# Patient Record
Sex: Female | Born: 1985 | Race: Black or African American | Hispanic: No | Marital: Single | Smoking: Former smoker
Health system: Southern US, Community
[De-identification: ages and names within clinical notes are randomized; demographics above are authoritative.]

## PROBLEM LIST (undated history)

## (undated) ENCOUNTER — Inpatient Hospital Stay: Payer: Self-pay

## (undated) ENCOUNTER — Emergency Department: Discharge status: Absent without leave | Payer: Medicare Other

## (undated) DIAGNOSIS — F32A Depression, unspecified: Secondary | ICD-10-CM

## (undated) DIAGNOSIS — F259 Schizoaffective disorder, unspecified: Secondary | ICD-10-CM

## (undated) DIAGNOSIS — D352 Benign neoplasm of pituitary gland: Secondary | ICD-10-CM

## (undated) DIAGNOSIS — F329 Major depressive disorder, single episode, unspecified: Secondary | ICD-10-CM

## (undated) HISTORY — DX: Benign neoplasm of pituitary gland: D35.2

## (undated) HISTORY — PX: NO PAST SURGERIES: SHX2092

---

## 2008-09-05 ENCOUNTER — Emergency Department: Payer: Self-pay | Admitting: Emergency Medicine

## 2008-09-06 ENCOUNTER — Observation Stay: Payer: Self-pay | Admitting: Obstetrics & Gynecology

## 2008-09-07 ENCOUNTER — Emergency Department: Payer: Self-pay | Admitting: Internal Medicine

## 2008-10-27 ENCOUNTER — Observation Stay: Payer: Self-pay | Admitting: Obstetrics and Gynecology

## 2008-10-28 ENCOUNTER — Observation Stay: Payer: Self-pay | Admitting: Obstetrics and Gynecology

## 2008-11-18 ENCOUNTER — Inpatient Hospital Stay: Payer: Self-pay

## 2008-11-30 ENCOUNTER — Observation Stay: Payer: Self-pay

## 2008-12-11 ENCOUNTER — Observation Stay: Payer: Self-pay | Admitting: Obstetrics & Gynecology

## 2008-12-14 ENCOUNTER — Observation Stay: Payer: Self-pay

## 2008-12-20 ENCOUNTER — Ambulatory Visit: Payer: Self-pay

## 2009-01-03 ENCOUNTER — Observation Stay: Payer: Self-pay

## 2009-01-07 ENCOUNTER — Inpatient Hospital Stay: Payer: Self-pay | Admitting: Obstetrics & Gynecology

## 2009-01-10 ENCOUNTER — Emergency Department: Payer: Self-pay

## 2009-04-11 ENCOUNTER — Ambulatory Visit: Payer: Self-pay | Admitting: Anesthesiology

## 2009-05-28 ENCOUNTER — Emergency Department: Payer: Self-pay

## 2009-07-11 ENCOUNTER — Emergency Department: Payer: Self-pay | Admitting: Emergency Medicine

## 2009-11-12 ENCOUNTER — Encounter: Payer: Self-pay | Admitting: Obstetrics and Gynecology

## 2010-01-10 ENCOUNTER — Emergency Department: Payer: Self-pay | Admitting: Emergency Medicine

## 2010-05-02 ENCOUNTER — Observation Stay: Payer: Self-pay | Admitting: Obstetrics & Gynecology

## 2010-05-04 ENCOUNTER — Observation Stay: Payer: Self-pay | Admitting: Obstetrics and Gynecology

## 2010-05-17 ENCOUNTER — Observation Stay: Payer: Self-pay | Admitting: Obstetrics and Gynecology

## 2010-05-29 ENCOUNTER — Observation Stay: Payer: Self-pay | Admitting: Obstetrics and Gynecology

## 2010-06-04 ENCOUNTER — Observation Stay: Payer: Self-pay | Admitting: Obstetrics and Gynecology

## 2010-06-18 ENCOUNTER — Inpatient Hospital Stay: Payer: Self-pay

## 2010-09-02 ENCOUNTER — Emergency Department: Payer: Self-pay | Admitting: *Deleted

## 2010-10-29 ENCOUNTER — Emergency Department: Payer: Self-pay | Admitting: Emergency Medicine

## 2010-11-27 ENCOUNTER — Ambulatory Visit: Payer: Self-pay | Admitting: Obstetrics and Gynecology

## 2010-12-14 ENCOUNTER — Emergency Department: Payer: Self-pay | Admitting: Emergency Medicine

## 2011-06-30 ENCOUNTER — Emergency Department: Payer: Self-pay | Admitting: Emergency Medicine

## 2011-06-30 LAB — COMPREHENSIVE METABOLIC PANEL
Albumin: 3.7 g/dL (ref 3.4–5.0)
Anion Gap: 11 (ref 7–16)
BUN: 8 mg/dL (ref 7–18)
Bilirubin,Total: 0.2 mg/dL (ref 0.2–1.0)
Chloride: 106 mmol/L (ref 98–107)
Co2: 22 mmol/L (ref 21–32)
EGFR (Non-African Amer.): >60
Osmolality: 275 (ref 275–301)
Potassium: 3.8 mmol/L (ref 3.5–5.1)
SGOT(AST): 26 U/L (ref 15–37)
SGPT (ALT): 19 U/L
Total Protein: 7.9 g/dL (ref 6.4–8.2)

## 2011-06-30 LAB — URINALYSIS, COMPLETE
Bilirubin,UR: NEGATIVE
Blood: NEGATIVE
Glucose,UR: NEGATIVE mg/dL (ref 0–75)
Ketone: NEGATIVE
Leukocyte Esterase: NEGATIVE
Nitrite: NEGATIVE
RBC,UR: 5 /HPF (ref 0–5)
Specific Gravity: 1.025 (ref 1.003–1.030)
WBC UR: 4 /HPF (ref 0–5)

## 2011-06-30 LAB — LIPASE, BLOOD: Lipase: 111 U/L (ref 73–393)

## 2011-06-30 LAB — HCG, QUANTITATIVE, PREGNANCY: Beta Hcg, Quant.: 75304 m[IU]/mL — ABNORMAL HIGH

## 2011-06-30 LAB — CBC
HGB: 12.7 g/dL (ref 12.0–16.0)
MCH: 28.3 pg (ref 26.0–34.0)
MCV: 87 fL (ref 80–100)

## 2012-08-20 ENCOUNTER — Ambulatory Visit: Payer: Self-pay | Admitting: Nurse Practitioner

## 2012-12-03 ENCOUNTER — Emergency Department: Payer: Self-pay | Admitting: Emergency Medicine

## 2012-12-03 LAB — URINALYSIS, COMPLETE
Bilirubin,UR: NEGATIVE
Blood: NEGATIVE
Glucose,UR: NEGATIVE mg/dL (ref 0–75)
Ketone: NEGATIVE
Nitrite: NEGATIVE
Ph: 6 (ref 4.5–8.0)
Protein: NEGATIVE
Specific Gravity: 1.02 (ref 1.003–1.030)
WBC UR: 4 /HPF (ref 0–5)

## 2012-12-03 LAB — PREGNANCY, URINE: Pregnancy Test, Urine: NEGATIVE m[IU]/mL

## 2013-01-14 ENCOUNTER — Emergency Department: Payer: Self-pay | Admitting: Emergency Medicine

## 2013-01-14 LAB — CBC
HGB: 12.7 g/dL (ref 12.0–16.0)
MCHC: 32.3 g/dL (ref 32.0–36.0)
Platelet: 218 10*3/uL (ref 150–440)
RBC: 4.5 10*6/uL (ref 3.80–5.20)
RDW: 13.5 % (ref 11.5–14.5)
WBC: 6.8 10*3/uL (ref 3.6–11.0)

## 2013-01-14 LAB — COMPREHENSIVE METABOLIC PANEL
Anion Gap: 8 (ref 7–16)
BUN: 9 mg/dL (ref 7–18)
Bilirubin,Total: 0.3 mg/dL (ref 0.2–1.0)
Calcium, Total: 8.7 mg/dL (ref 8.5–10.1)
Co2: 24 mmol/L (ref 21–32)
Creatinine: 0.69 mg/dL (ref 0.60–1.30)
EGFR (African American): >60
Glucose: 93 mg/dL (ref 65–99)
Osmolality: 274 (ref 275–301)
Potassium: 4.2 mmol/L (ref 3.5–5.1)
SGOT(AST): 13 U/L — ABNORMAL LOW (ref 15–37)
SGPT (ALT): 21 U/L (ref 12–78)
Sodium: 138 mmol/L (ref 136–145)

## 2013-03-13 ENCOUNTER — Emergency Department: Payer: Self-pay | Admitting: Emergency Medicine

## 2013-03-13 LAB — COMPREHENSIVE METABOLIC PANEL
ALK PHOS: 56 U/L
Albumin: 3.6 g/dL (ref 3.4–5.0)
Anion Gap: 6 — ABNORMAL LOW (ref 7–16)
BUN: 7 mg/dL (ref 7–18)
Bilirubin,Total: 0.2 mg/dL (ref 0.2–1.0)
CO2: 24 mmol/L (ref 21–32)
Calcium, Total: 8.5 mg/dL (ref 8.5–10.1)
Chloride: 105 mmol/L (ref 98–107)
Creatinine: 0.77 mg/dL (ref 0.60–1.30)
GLUCOSE: 87 mg/dL (ref 65–99)
Osmolality: 267 (ref 275–301)
Potassium: 3.5 mmol/L (ref 3.5–5.1)
SGOT(AST): 22 U/L (ref 15–37)
SGPT (ALT): 17 U/L (ref 12–78)
Sodium: 135 mmol/L — ABNORMAL LOW (ref 136–145)
Total Protein: 7.3 g/dL (ref 6.4–8.2)

## 2013-03-13 LAB — URINALYSIS, COMPLETE
BILIRUBIN, UR: NEGATIVE
Bacteria: NONE SEEN
Blood: NEGATIVE
Glucose,UR: NEGATIVE mg/dL (ref 0–75)
KETONE: NEGATIVE
Leukocyte Esterase: NEGATIVE
NITRITE: NEGATIVE
PROTEIN: NEGATIVE
Ph: 5 (ref 4.5–8.0)
RBC,UR: 1 /HPF (ref 0–5)
Specific Gravity: 1.02 (ref 1.003–1.030)
WBC UR: <1 /HPF (ref 0–5)

## 2013-03-13 LAB — CBC WITH DIFFERENTIAL/PLATELET
BASOS ABS: 0 10*3/uL (ref 0.0–0.1)
BASOS PCT: 0.5 %
Eosinophil #: 0.1 10*3/uL (ref 0.0–0.7)
Eosinophil %: 0.9 %
HCT: 38.2 % (ref 35.0–47.0)
HGB: 12.2 g/dL (ref 12.0–16.0)
Lymphocyte #: 2.3 10*3/uL (ref 1.0–3.6)
Lymphocyte %: 23 %
MCH: 27.5 pg (ref 26.0–34.0)
MCHC: 31.8 g/dL — AB (ref 32.0–36.0)
MCV: 86 fL (ref 80–100)
Monocyte #: 0.6 x10 3/mm (ref 0.2–0.9)
Monocyte %: 5.6 %
Neutrophil #: 7 10*3/uL — ABNORMAL HIGH (ref 1.4–6.5)
Neutrophil %: 70 %
PLATELETS: 224 10*3/uL (ref 150–440)
RBC: 4.42 10*6/uL (ref 3.80–5.20)
RDW: 13.1 % (ref 11.5–14.5)
WBC: 10 10*3/uL (ref 3.6–11.0)

## 2013-03-13 LAB — LIPASE, BLOOD: Lipase: 127 U/L (ref 73–393)

## 2013-03-14 LAB — HCG, QUANTITATIVE, PREGNANCY: Beta Hcg, Quant.: 5700 m[IU]/mL — ABNORMAL HIGH

## 2013-03-14 LAB — GC/CHLAMYDIA PROBE AMP

## 2013-03-14 LAB — WET PREP, GENITAL

## 2013-04-15 ENCOUNTER — Emergency Department: Payer: Self-pay | Admitting: Emergency Medicine

## 2013-04-15 LAB — URINALYSIS, COMPLETE
BLOOD: NEGATIVE
Bacteria: NONE SEEN
Bilirubin,UR: NEGATIVE
GLUCOSE, UR: NEGATIVE mg/dL (ref 0–75)
Ketone: NEGATIVE
Leukocyte Esterase: NEGATIVE
NITRITE: NEGATIVE
PH: 6 (ref 4.5–8.0)
PROTEIN: NEGATIVE
SPECIFIC GRAVITY: 1.027 (ref 1.003–1.030)
Squamous Epithelial: 1

## 2013-04-15 LAB — CBC
HCT: 37.5 % (ref 35.0–47.0)
HGB: 12.2 g/dL (ref 12.0–16.0)
MCH: 28.1 pg (ref 26.0–34.0)
MCHC: 32.7 g/dL (ref 32.0–36.0)
MCV: 86 fL (ref 80–100)
PLATELETS: 192 10*3/uL (ref 150–440)
RBC: 4.35 10*6/uL (ref 3.80–5.20)
RDW: 13.7 % (ref 11.5–14.5)
WBC: 8.7 10*3/uL (ref 3.6–11.0)

## 2013-04-15 LAB — COMPREHENSIVE METABOLIC PANEL
ALBUMIN: 3.5 g/dL (ref 3.4–5.0)
Alkaline Phosphatase: 53 U/L
Anion Gap: 4 — ABNORMAL LOW (ref 7–16)
BUN: 8 mg/dL (ref 7–18)
Bilirubin,Total: 0.3 mg/dL (ref 0.2–1.0)
CREATININE: 0.63 mg/dL (ref 0.60–1.30)
Calcium, Total: 8.8 mg/dL (ref 8.5–10.1)
Chloride: 104 mmol/L (ref 98–107)
Co2: 27 mmol/L (ref 21–32)
EGFR (African American): >60
GLUCOSE: 67 mg/dL (ref 65–99)
OSMOLALITY: 267 (ref 275–301)
Potassium: 3.8 mmol/L (ref 3.5–5.1)
SGOT(AST): 21 U/L (ref 15–37)
SGPT (ALT): 16 U/L (ref 12–78)
Sodium: 135 mmol/L — ABNORMAL LOW (ref 136–145)
Total Protein: 7.4 g/dL (ref 6.4–8.2)

## 2013-04-15 LAB — HCG, QUANTITATIVE, PREGNANCY: Beta Hcg, Quant.: 107896 m[IU]/mL — ABNORMAL HIGH

## 2013-04-19 ENCOUNTER — Emergency Department: Payer: Self-pay | Admitting: Emergency Medicine

## 2013-04-19 LAB — URINALYSIS, COMPLETE
BILIRUBIN, UR: NEGATIVE
Glucose,UR: NEGATIVE mg/dL (ref 0–75)
Ketone: NEGATIVE
Nitrite: NEGATIVE
PH: 6 (ref 4.5–8.0)
RBC,UR: 2322 /HPF (ref 0–5)
SPECIFIC GRAVITY: 1.027 (ref 1.003–1.030)
WBC UR: 5 /HPF (ref 0–5)

## 2013-04-19 LAB — CBC
HCT: 36 % (ref 35.0–47.0)
HGB: 12 g/dL (ref 12.0–16.0)
MCH: 28.6 pg (ref 26.0–34.0)
MCHC: 33.2 g/dL (ref 32.0–36.0)
MCV: 86 fL (ref 80–100)
PLATELETS: 195 10*3/uL (ref 150–440)
RBC: 4.17 10*6/uL (ref 3.80–5.20)
RDW: 14 % (ref 11.5–14.5)
WBC: 8.4 10*3/uL (ref 3.6–11.0)

## 2013-04-19 LAB — GC/CHLAMYDIA PROBE AMP

## 2013-04-19 LAB — HCG, QUANTITATIVE, PREGNANCY: Beta Hcg, Quant.: 89889 m[IU]/mL — ABNORMAL HIGH

## 2013-04-19 LAB — WET PREP, GENITAL

## 2013-06-03 ENCOUNTER — Emergency Department: Payer: Self-pay | Admitting: Emergency Medicine

## 2013-06-18 DIAGNOSIS — IMO0002 Reserved for concepts with insufficient information to code with codable children: Secondary | ICD-10-CM | POA: Insufficient documentation

## 2013-06-28 ENCOUNTER — Observation Stay: Payer: Self-pay

## 2013-06-28 LAB — URINALYSIS, COMPLETE
BILIRUBIN, UR: NEGATIVE
Bacteria: NONE SEEN
Blood: NEGATIVE
Leukocyte Esterase: NEGATIVE
NITRITE: NEGATIVE
Ph: 6 (ref 4.5–8.0)
Protein: NEGATIVE
RBC,UR: <1 /HPF (ref 0–5)
Specific Gravity: 1.008 (ref 1.003–1.030)
Squamous Epithelial: NONE SEEN

## 2013-06-28 LAB — COMPREHENSIVE METABOLIC PANEL
ALBUMIN: 3.1 g/dL — AB (ref 3.4–5.0)
Alkaline Phosphatase: 52 U/L
Anion Gap: 7 (ref 7–16)
BUN: 5 mg/dL — AB (ref 7–18)
Bilirubin,Total: 0.3 mg/dL (ref 0.2–1.0)
Calcium, Total: 8.8 mg/dL (ref 8.5–10.1)
Chloride: 105 mmol/L (ref 98–107)
Co2: 26 mmol/L (ref 21–32)
Creatinine: 0.51 mg/dL — ABNORMAL LOW (ref 0.60–1.30)
EGFR (Non-African Amer.): >60
Glucose: 84 mg/dL (ref 65–99)
OSMOLALITY: 272 (ref 275–301)
Potassium: 3.4 mmol/L — ABNORMAL LOW (ref 3.5–5.1)
SGOT(AST): 18 U/L (ref 15–37)
SGPT (ALT): 17 U/L (ref 12–78)
Sodium: 138 mmol/L (ref 136–145)
Total Protein: 6.9 g/dL (ref 6.4–8.2)

## 2013-06-28 LAB — CBC
HCT: 33.4 % — ABNORMAL LOW (ref 35.0–47.0)
HGB: 10.6 g/dL — ABNORMAL LOW (ref 12.0–16.0)
MCH: 27.3 pg (ref 26.0–34.0)
MCHC: 31.9 g/dL — AB (ref 32.0–36.0)
MCV: 86 fL (ref 80–100)
PLATELETS: 168 10*3/uL (ref 150–440)
RBC: 3.9 10*6/uL (ref 3.80–5.20)
RDW: 13 % (ref 11.5–14.5)
WBC: 7.7 10*3/uL (ref 3.6–11.0)

## 2013-07-14 ENCOUNTER — Observation Stay: Payer: Self-pay | Admitting: Obstetrics and Gynecology

## 2013-07-14 LAB — URINALYSIS, COMPLETE
Bilirubin,UR: NEGATIVE
Blood: NEGATIVE
GLUCOSE, UR: NEGATIVE mg/dL (ref 0–75)
Ketone: NEGATIVE
Leukocyte Esterase: NEGATIVE
Nitrite: NEGATIVE
PROTEIN: NEGATIVE
Ph: 7 (ref 4.5–8.0)
Specific Gravity: 1.024 (ref 1.003–1.030)
WBC UR: 1 /HPF (ref 0–5)

## 2013-07-26 ENCOUNTER — Observation Stay: Payer: Self-pay

## 2013-09-13 ENCOUNTER — Observation Stay: Payer: Self-pay | Admitting: Obstetrics and Gynecology

## 2013-09-13 LAB — URINALYSIS, COMPLETE
Bilirubin,UR: NEGATIVE
Blood: NEGATIVE
GLUCOSE, UR: NEGATIVE mg/dL (ref 0–75)
LEUKOCYTE ESTERASE: NEGATIVE
Nitrite: NEGATIVE
PH: 7 (ref 4.5–8.0)
PROTEIN: NEGATIVE
SPECIFIC GRAVITY: 1.018 (ref 1.003–1.030)
Squamous Epithelial: 4

## 2013-09-13 LAB — DRUG SCREEN, URINE
Amphetamines, Ur Screen: NEGATIVE (ref ?–1000)
BARBITURATES, UR SCREEN: NEGATIVE (ref ?–200)
BENZODIAZEPINE, UR SCRN: NEGATIVE (ref ?–200)
CANNABINOID 50 NG, UR ~~LOC~~: NEGATIVE (ref ?–50)
Cocaine Metabolite,Ur ~~LOC~~: NEGATIVE (ref ?–300)
MDMA (ECSTASY) UR SCREEN: NEGATIVE (ref ?–500)
METHADONE, UR SCREEN: NEGATIVE (ref ?–300)
Opiate, Ur Screen: NEGATIVE (ref ?–300)
PHENCYCLIDINE (PCP) UR S: NEGATIVE (ref ?–25)
Tricyclic, Ur Screen: NEGATIVE (ref ?–1000)

## 2013-10-17 ENCOUNTER — Observation Stay: Payer: Self-pay

## 2014-02-21 ENCOUNTER — Emergency Department: Payer: Self-pay | Admitting: Emergency Medicine

## 2014-02-21 LAB — URINALYSIS, COMPLETE
BILIRUBIN, UR: NEGATIVE
Bacteria: NONE SEEN
Blood: NEGATIVE
Glucose,UR: NEGATIVE mg/dL (ref 0–75)
Leukocyte Esterase: NEGATIVE
Nitrite: NEGATIVE
PH: 6 (ref 4.5–8.0)
PROTEIN: NEGATIVE
RBC,UR: 2 /HPF (ref 0–5)
SPECIFIC GRAVITY: 1.027 (ref 1.003–1.030)
Squamous Epithelial: 4
WBC UR: 3 /HPF (ref 0–5)

## 2014-02-28 ENCOUNTER — Ambulatory Visit: Payer: Self-pay | Admitting: Family Medicine

## 2014-04-30 ENCOUNTER — Emergency Department: Payer: Self-pay | Admitting: Emergency Medicine

## 2014-05-11 ENCOUNTER — Encounter
Admit: 2014-05-11 | Disposition: A | Discharge status: Home / Self Care | Payer: Self-pay | Attending: Obstetrics and Gynecology | Admitting: Obstetrics and Gynecology
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
## 2014-09-28 ENCOUNTER — Ambulatory Visit
Admission: RE | Admit: 2014-09-28 | Discharge: 2014-09-28 | Disposition: A | Discharge status: Home / Self Care | Payer: Medicare Other | Source: Ambulatory Visit | Attending: Family Medicine | Admitting: Family Medicine

## 2014-09-28 ENCOUNTER — Other Ambulatory Visit: Payer: Self-pay | Admitting: Family Medicine

## 2014-09-28 DIAGNOSIS — K5900 Constipation, unspecified: Secondary | ICD-10-CM | POA: Insufficient documentation

## 2014-12-04 ENCOUNTER — Ambulatory Visit (INDEPENDENT_AMBULATORY_CARE_PROVIDER_SITE_OTHER): Payer: Medicare Other | Admitting: Licensed Clinical Social Worker

## 2014-12-04 DIAGNOSIS — F259 Schizoaffective disorder, unspecified: Secondary | ICD-10-CM
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
## 2014-12-15 ENCOUNTER — Ambulatory Visit: Payer: Self-pay | Admitting: Psychiatry

## 2015-01-30 ENCOUNTER — Ambulatory Visit: Payer: Medicare Other | Admitting: Psychiatry

## 2015-08-30 ENCOUNTER — Observation Stay
Admission: EM | Admit: 2015-08-30 | Discharge: 2015-08-31 | Disposition: A | Discharge status: Home / Self Care | Payer: Medicare Other | Attending: Obstetrics and Gynecology | Admitting: Obstetrics and Gynecology

## 2015-08-30 ENCOUNTER — Observation Stay: Payer: Medicare Other

## 2015-08-30 DIAGNOSIS — Z3A01 Less than 8 weeks gestation of pregnancy: Secondary | ICD-10-CM | POA: Diagnosis not present

## 2015-08-30 DIAGNOSIS — F329 Major depressive disorder, single episode, unspecified: Secondary | ICD-10-CM | POA: Diagnosis not present

## 2015-08-30 DIAGNOSIS — Z79899 Other long term (current) drug therapy: Secondary | ICD-10-CM | POA: Diagnosis not present

## 2015-08-30 DIAGNOSIS — Z818 Family history of other mental and behavioral disorders: Secondary | ICD-10-CM | POA: Insufficient documentation

## 2015-08-30 DIAGNOSIS — Z8249 Family history of ischemic heart disease and other diseases of the circulatory system: Secondary | ICD-10-CM | POA: Insufficient documentation

## 2015-08-30 DIAGNOSIS — O99341 Other mental disorders complicating pregnancy, first trimester: Secondary | ICD-10-CM | POA: Diagnosis not present

## 2015-08-30 DIAGNOSIS — O219 Vomiting of pregnancy, unspecified: Secondary | ICD-10-CM

## 2015-08-30 DIAGNOSIS — F259 Schizoaffective disorder, unspecified: Secondary | ICD-10-CM | POA: Insufficient documentation

## 2015-08-30 DIAGNOSIS — Z3490 Encounter for supervision of normal pregnancy, unspecified, unspecified trimester: Secondary | ICD-10-CM

## 2015-08-30 DIAGNOSIS — O211 Hyperemesis gravidarum with metabolic disturbance: Principal | ICD-10-CM | POA: Diagnosis present

## 2015-08-30 DIAGNOSIS — Z833 Family history of diabetes mellitus: Secondary | ICD-10-CM | POA: Diagnosis not present

## 2015-08-30 HISTORY — DX: Major depressive disorder, single episode, unspecified: F32.9

## 2015-08-30 HISTORY — DX: Schizoaffective disorder, unspecified: F25.9

## 2015-08-30 HISTORY — DX: Depression, unspecified: F32.A

## 2015-08-30 LAB — CBC
HEMATOCRIT: 39.2 % (ref 35.0–47.0)
HEMOGLOBIN: 13.3 g/dL (ref 12.0–16.0)
MCH: 29.3 pg (ref 26.0–34.0)
MCHC: 34 g/dL (ref 32.0–36.0)
MCV: 86.1 fL (ref 80.0–100.0)
Platelets: 217 10*3/uL (ref 150–440)
RBC: 4.55 MIL/uL (ref 3.80–5.20)
RDW: 15 % — ABNORMAL HIGH (ref 11.5–14.5)
WBC: 7.9 10*3/uL (ref 3.6–11.0)

## 2015-08-30 LAB — COMPREHENSIVE METABOLIC PANEL
ALBUMIN: 4.3 g/dL (ref 3.5–5.0)
ALK PHOS: 53 U/L (ref 38–126)
ALT: 13 U/L — ABNORMAL LOW (ref 14–54)
ANION GAP: 8 (ref 5–15)
AST: 18 U/L (ref 15–41)
BILIRUBIN TOTAL: 0.6 mg/dL (ref 0.3–1.2)
BUN: 8 mg/dL (ref 6–20)
CALCIUM: 9.3 mg/dL (ref 8.9–10.3)
CO2: 23 mmol/L (ref 22–32)
Chloride: 105 mmol/L (ref 101–111)
Creatinine, Ser: 0.64 mg/dL (ref 0.44–1.00)
GLUCOSE: 100 mg/dL — AB (ref 65–99)
POTASSIUM: 3.6 mmol/L (ref 3.5–5.1)
Sodium: 136 mmol/L (ref 135–145)
TOTAL PROTEIN: 7.3 g/dL (ref 6.5–8.1)

## 2015-08-30 LAB — URINALYSIS COMPLETE WITH MICROSCOPIC (ARMC ONLY)
Bilirubin Urine: NEGATIVE
Glucose, UA: NEGATIVE mg/dL
HGB URINE DIPSTICK: NEGATIVE
KETONES UR: NEGATIVE mg/dL
NITRITE: NEGATIVE
PROTEIN: 100 mg/dL — AB
SPECIFIC GRAVITY, URINE: 1.025 (ref 1.005–1.030)
pH: 8 (ref 5.0–8.0)

## 2015-08-30 LAB — T4, FREE: FREE T4: 0.92 ng/dL (ref 0.61–1.12)

## 2015-08-30 LAB — HCG, QUANTITATIVE, PREGNANCY: hCG, Beta Chain, Quant, S: 48265 m[IU]/mL — ABNORMAL HIGH (ref <5)

## 2015-08-30 LAB — POCT PREGNANCY, URINE: PREG TEST UR: POSITIVE — AB

## 2015-08-30 LAB — TSH: TSH: 0.803 u[IU]/mL (ref 0.350–4.500)

## 2015-08-30 LAB — LIPASE, BLOOD: Lipase: 16 U/L (ref 11–51)

## 2015-08-30 MED ORDER — FAMOTIDINE IN NACL 20-0.9 MG/50ML-% IV SOLN
20.0000 mg | Freq: Two times a day (BID) | INTRAVENOUS | Status: DC
  Administered 2015-08-30 – 2015-08-31 (×2): 20 mg via INTRAVENOUS
  Filled 2015-08-30 (×2): qty 50

## 2015-08-30 MED ORDER — THIAMINE HCL 100 MG/ML IJ SOLN
Freq: Once | INTRAVENOUS | Status: AC
  Administered 2015-08-30: 20:00:00 via INTRAVENOUS
  Filled 2015-08-30: qty 1000

## 2015-08-30 MED ORDER — SODIUM CHLORIDE 0.9 % IV BOLUS (SEPSIS)
1000.0000 mL | Freq: Once | INTRAVENOUS | Status: AC
  Administered 2015-08-30: 1000 mL via INTRAVENOUS

## 2015-08-30 MED ORDER — PROMETHAZINE HCL 25 MG/ML IJ SOLN
12.5000 mg | Freq: Once | INTRAMUSCULAR | Status: AC
  Administered 2015-08-30: 12.5 mg via INTRAVENOUS
  Filled 2015-08-30: qty 1

## 2015-08-30 MED ORDER — LAMOTRIGINE 25 MG PO TABS
100.0000 mg | ORAL_TABLET | Freq: Every day | ORAL | Status: DC
  Administered 2015-08-31: 100 mg via ORAL
  Filled 2015-08-30: qty 4

## 2015-08-30 MED ORDER — PYRIDOXINE HCL 25 MG PO TABS
25.0000 mg | ORAL_TABLET | Freq: Three times a day (TID) | ORAL | Status: DC
  Administered 2015-08-30 – 2015-08-31 (×2): 25 mg via ORAL
  Filled 2015-08-30 (×3): qty 1

## 2015-08-30 MED ORDER — DULOXETINE HCL 60 MG PO CPEP
60.0000 mg | ORAL_CAPSULE | Freq: Every morning | ORAL | Status: DC
  Administered 2015-08-31: 60 mg via ORAL
  Filled 2015-08-30: qty 1

## 2015-08-30 MED ORDER — DEXTROSE-NACL 5-0.45 % IV SOLN
INTRAVENOUS | Status: DC
  Administered 2015-08-31: 06:00:00 via INTRAVENOUS

## 2015-08-30 MED ORDER — PROCHLORPERAZINE EDISYLATE 5 MG/ML IJ SOLN
10.0000 mg | Freq: Three times a day (TID) | INTRAMUSCULAR | Status: DC
  Administered 2015-08-30 – 2015-08-31 (×2): 10 mg via INTRAVENOUS
  Filled 2015-08-30 (×2): qty 2

## 2015-08-30 MED ORDER — SODIUM CHLORIDE 0.9 % IV SOLN
Freq: Once | INTRAVENOUS | Status: AC
  Administered 2015-08-30: 13:00:00 via INTRAVENOUS

## 2015-08-30 MED ORDER — PROMETHAZINE HCL 25 MG/ML IJ SOLN
25.0000 mg | Freq: Once | INTRAMUSCULAR | Status: AC
  Administered 2015-08-30: 25 mg via INTRAVENOUS
  Filled 2015-08-30: qty 1

## 2015-08-30 MED ORDER — VITAMIN B-1 100 MG PO TABS
100.0000 mg | ORAL_TABLET | Freq: Every day | ORAL | Status: DC
  Administered 2015-08-31: 100 mg via ORAL
  Filled 2015-08-30: qty 1

## 2015-08-30 MED ORDER — QUETIAPINE FUMARATE ER 200 MG PO TB24
200.0000 mg | ORAL_TABLET | Freq: Every day | ORAL | Status: DC
  Administered 2015-08-30: 200 mg via ORAL
  Filled 2015-08-30: qty 1

## 2015-08-30 NOTE — ED Provider Notes (Signed)
[facility] Emergency Department Provider Note   ____________________________________________  Time seen: Approximately 1:03 PM  I have reviewed the triage vital signs and the nursing notes.   HISTORY  Chief Complaint Nausea and Vomiting   HPI Susan [name] is a 30 y.o. female patient in early pregnancy she's not sure how early because she did not have periods when she had her birth control removed. This is her fourth pregnancy she has 3 live children. She had hyperemesis gravidarum with the third pregnancy would not first 2. She is having a lot of nausea and vomiting with this one as well. Her primary care gave her B6 and Unisom earlier but this has not helped at all. She has no other complaints except she says that her ribs hurt a little bit after vomiting so much. She is not coughing is not having diarrheanot having any other symptoms.   Past Medical History  Diagnosis Date  . Depression   . Schizoaffective disorder [facility])     Patient Active Problem List   Diagnosis Date Noted  . Hyperemesis gravidarum before end of [redacted] week gestation, dehydration 08/30/2015  . Supervision of high risk pregnancy in first trimester 08/30/2015  . Hyperemesis gravidarum before end of [redacted] week gestation with dehydration 08/30/2015    Past Surgical History  Procedure Laterality Date  . No past surgeries      No current outpatient prescriptions on file.  Allergies Review of patient's allergies indicates no known allergies.  Family History  Problem Relation Age of Onset  . Diabetes type II Mother   . Depression Mother   . Hypertension Paternal Grandmother   . Diabetes type II Paternal Grandmother     Social History Social History  Substance Use Topics  . Smoking status: Never Smoker   . Smokeless tobacco: None  . Alcohol Use: No    Review of Systems Constitutional: No fever/chills Eyes: No visual changes. ENT: No sore throat. Cardiovascular: Denies  chest pain. Respiratory: Denies shortness of breath. Gastrointestinal: No abdominal pain.  nausea,  vomiting.  No diarrhea.  No constipation. Genitourinary: Negative for dysuria. Musculoskeletal: Negative for back pain. Skin: Negative for rash. Neurological: Negative for headaches, focal weakness or numbness.  10-point ROS otherwise negative.  ____________________________________________   PHYSICAL EXAM:  VITAL SIGNS: ED Triage Vitals  Enc Vitals Group     BP 08/30/15 0954 122/81 mmHg     Pulse Rate 08/30/15 0954 78     Resp 08/30/15 0954 18     Temp 08/30/15 0954 98.4 F (36.9 C)     Temp Source 08/30/15 0954 Oral     SpO2 08/30/15 0954 100 %     Weight 08/30/15 0954 211 lb (95.709 kg)     Height 08/30/15 0954 5\' 7"  (1.702 m)     Head Cir --      Peak Flow --      Pain Score 08/30/15 1213 5     Pain Loc --      Pain Edu? --      Excl. in [place]? --     Constitutional: Alert and oriented. Well appearing and in no acute distress. Eyes: Conjunctivae are normal. PERRL. EOMI. Head: Atraumatic. Nose: No congestion/rhinnorhea. Mouth/Throat: Mucous membranes are moist.  Oropharynx non-erythematous. Neck: No stridor.   Cardiovascular: Normal rate, regular rhythm. Grossly normal heart sounds.  Good peripheral circulation. Respiratory: Normal respiratory effort.  No retractions. Lungs CTAB. Gastrointestinal: Soft and nontender. No distention. No abdominal bruits. No  CVA tenderness. Musculoskeletal: No lower extremity tenderness nor edema.  No joint effusions. Neurologic:  Normal speech and language. No gross focal neurologic deficits are appreciated. No gait instability. Skin:  Skin is warm, dry and intact. No rash noted. Psychiatric: Mood and affect are normal. Speech and behavior are normal.  ____________________________________________   LABS (all labs ordered are listed, but only abnormal results are displayed)  Labs Reviewed  COMPREHENSIVE METABOLIC PANEL - Abnormal;  Notable for the following:    Glucose, Bld 100 (*)    ALT 13 (*)    All other components within normal limits  CBC - Abnormal; Notable for the following:    RDW 15.0 (*)    All other components within normal limits  URINALYSIS COMPLETEWITH MICROSCOPIC ([facility] ONLY) - Abnormal; Notable for the following:    Color, Urine AMBER (*)    APPearance CLOUDY (*)    Protein, ur 100 (*)    Leukocytes, UA 1+ (*)    Bacteria, UA RARE (*)    Squamous Epithelial / LPF TOO NUMEROUS TO COUNT (*)    All other components within normal limits  HCG, QUANTITATIVE, PREGNANCY - Abnormal; Notable for the following:    hCG, Beta Chain, Quant, S 48265 (*)    All other components within normal limits  POCT PREGNANCY, URINE - Abnormal; Notable for the following:    Preg Test, Ur POSITIVE (*)    All other components within normal limits  URINE CULTURE  LIPASE, BLOOD  TSH  T4, FREE  POC URINE PREG, ED   ____________________________________________  EKG   ____________________________________________  RADIOLOGY   ____________________________________________   PROCEDURES    Procedures    ____________________________________________   INITIAL IMPRESSION / ASSESSMENT AND PLAN / ED COURSE  Pertinent labs & imaging results that were available during my care of the patient were reviewed by me and considered in my medical decision making (see chart for details).  Patient gets a second dose of IV Phenergan but continues to vomit. We'll call [facility] ____________________________________________   FINAL CLINICAL IMPRESSION(S) / ED DIAGNOSES  Final diagnoses:  Nausea and vomiting during pregnancy      NEW MEDICATIONS STARTED DURING THIS VISIT:  Current Discharge Medication List       Note:  This document was prepared using Dragon voice recognition software and may include unintentional dictation errors.    Nena Polio, MD 08/30/15 2146

## 2015-08-30 NOTE — ED Notes (Signed)
Pt presents with N/V x 1 week.  Diagnosed with hyperemesis gravidarum with previous pregnancies.  8 episodes of emesis with dry heaves today.  Cannot keep food or liquids down.

## 2015-08-30 NOTE — ED Notes (Signed)
POC urine pregnancy POSITIVE

## 2015-08-30 NOTE — ED Notes (Signed)
Pt arrives to ER via POV c/o nausea and vomiting X 2 days. Pt states that she is pregnant, unknown how many weeks because she has not had periods due to Implanon (taken out 08/11/15). Pt has OBGYN appt with [facility] coming up. Pt alert and oriented X4, active, cooperative, pt in NAD. RR even and unlabored, color WNL.

## 2015-08-30 NOTE — ED Notes (Signed)
Pt taken to Ultrasound.

## 2015-08-30 NOTE — Progress Notes (Signed)
U/S reviewed.  Single living intrauterine pregnancy  EDD 04/25/15, today's gestational age [redacted]w[redacted]d. No abnormal findings.

## 2015-08-30 NOTE — ED Notes (Signed)
Pt saw PCP yesterday and given B6 and Unisom pill.

## 2015-08-30 NOTE — H&P (Signed)
GYNECOLOGY ADMISSION HISTORY AND PHYSICAL NOTE    Attending Provider: Will Bonnet, MD   MEIKA MACARAEG [id number] 08/30/2015 4:01 PM    Chief Complaint:   Susan [name] is a 30 y.o. [phone] female, currently pregnant, who presents with intractable nausea.  History of Present Ilness:   Susan [name] presents with 2 days of nausea and inability to tolerate food or liquid. She presented to her PCP 2 days ago, [name], and was given vitamin B6 and Unisom for her nausea. This has provided her with no relief. On June 1 she had her next will not removed. She has been sexually active since that time and has had no menses. Therefore, she is unsure of her due date. She had a positive pregnancy test at [name] on about July 6. She denies vaginal bleeding. She has had left lower quadrant abdominal pain. She describes this as dull and somewhat crampy. The pain is not always present. Nothing seems to make it better or worse. She has no other associated symptoms. The pain is a 5 out of 10. During her last pregnancy she had hyperemesis. She was treated as an outpatient for this. She states that the nausea that she has currently is much worse. She has been given 2 doses of Phenergan in the emergency room and boluses of saline with no improvement in symptoms   Past Medical History  Diagnosis Date  . Depression   . Schizoaffective disorder [facility])    Past Surgical History  Procedure Laterality Date  . No past surgeries     Allergies: No Known Allergies   Prior to Admission medications   Medication Sig Start Date End Date Taking? Authorizing Provider  DULoxetine (CYMBALTA) 60 MG capsule Take 60 mg by mouth daily.   Yes Historical Provider, MD  lamoTRIgine (LAMICTAL) 100 MG tablet Take 100 mg by mouth daily.   Yes Historical Provider, MD  metroNIDAZOLE (FLAGYL) 500 MG tablet Take 500 mg by mouth 2 (two) times daily. For 7 days.   Yes Historical Provider, MD  QUEtiapine (SEROQUEL XR) 200 MG 24  hr tablet Take 200 mg by mouth at bedtime.   Yes Historical Provider, MD    Obstetric History: She is a [id number] female s/p SVD x3.    Gynecologic History:   Denies a history of abnormal pap smears and STDs.   Social History:  She  reports that she has never smoked. She does not have any smokeless tobacco history on file. She reports that she does not drink alcohol or use illicit drugs.  Family History:  family history includes Depression in her mother; Diabetes type II in her mother and paternal grandmother; Hypertension in her paternal grandmother.   Review of Systems:  Negative x 10 systems reviewed except as noted in the HPI.    Objective    BP 102/59 mmHg  Pulse 71  Temp(Src) 98.4 F (36.9 C) (Oral)  Resp 18  Ht 5\' 7"  (1.702 m)  Wt 95.709 kg (211 lb)  BMI 33.04 kg/m2  SpO2 100%  LMP 09/07/2014 (Exact Date) Physical Exam  General:  She is a ill appearing female in mild distress.  HEENT:  Normocephalic, atraumatic.   Neck:  supple, no lymphadenopathy Cardiac:  RRR Pulmonary:  Clear to auscultation bilaterally. No wheezes, rales, rhonchi.   Abdomen:  Soft, mildly TTP in LLQ. No rebound tenderness. No guarding. NABS.  Pelvic:  Deferred Extremities:  Non-tender, symmetric no edema bilaterally.   Neurologic:  Alert & oriented  x 3.  Appropriate, conversant.    Laboratory Results:   Lab Results  Component Value Date   WBC 7.9 08/30/2015   RBC 4.55 08/30/2015   HGB 13.3 08/30/2015   HCT 39.2 08/30/2015   PLT 217 08/30/2015   NA 136 08/30/2015   K 3.6 08/30/2015   CREATININE 0.64 08/30/2015   Lab Results  Component Value Date   PREGTESTUR POSITIVE* 08/30/2015    Assessment & Plan   DANAY [name] is a 30 y.o. [id number] female with hyperemesis gravidarum. She is at an uncertain gestational age.  I would guess at about 7-8 weeks.  Plan:  1.  Admit for IV hydration and control of nausea 2.  U/S for dating and verifying normal pregnancy with LLQ pain 3.  Add thyroid  function tests.  Will Bonnet, MD 08/30/2015 4:01 PM

## 2015-08-31 DIAGNOSIS — O211 Hyperemesis gravidarum with metabolic disturbance: Secondary | ICD-10-CM | POA: Diagnosis not present

## 2015-08-31 MED ORDER — PYRIDOXINE HCL 25 MG PO TABS: 25.0000 mg | ORAL_TABLET | Freq: Three times a day (TID) | ORAL | Status: DC

## 2015-08-31 MED ORDER — THIAMINE HCL 100 MG PO TABS: 100.0000 mg | ORAL_TABLET | Freq: Every day | ORAL | Status: DC

## 2015-08-31 MED ORDER — ONDANSETRON 8 MG PO TBDP: 8.0000 mg | ORAL_TABLET | Freq: Three times a day (TID) | ORAL | Status: DC | PRN

## 2015-08-31 MED ORDER — PROMETHAZINE HCL 12.5 MG RE SUPP: 12.5000 mg | Freq: Four times a day (QID) | RECTAL | Status: DC | PRN

## 2015-08-31 MED ORDER — PROCHLORPERAZINE MALEATE 10 MG PO TABS: 10.0000 mg | ORAL_TABLET | Freq: Four times a day (QID) | ORAL | Status: DC | PRN

## 2015-08-31 NOTE — Discharge Instructions (Signed)
Morning Sickness Morning sickness is when you feel sick to your stomach (nauseous) during pregnancy. You may feel sick to your stomach and throw up (vomit). You may feel sick in the morning, but you can feel this way any time of day. Some women feel very sick to their stomach and cannot stop throwing up (hyperemesis gravidarum). HOME CARE  Only take medicines as told by your doctor.  Take multivitamins as told by your doctor. Taking multivitamins before getting pregnant can stop or lessen the harshness of morning sickness.  Eat dry toast or unsalted crackers before getting out of bed.  Eat 5 to 6 small meals a day.  Eat dry and bland foods like rice and baked potatoes.  Do not drink liquids with meals. Drink between meals.  Do not eat greasy, fatty, or spicy foods.  Have someone cook for you if the smell of food causes you to feel sick or throw up.  If you feel sick to your stomach after taking prenatal vitamins, take them at night or with a snack.  Eat protein when you need a snack (nuts, yogurt, cheese).  Eat unsweetened gelatins for dessert.  Wear a bracelet used for sea sickness (acupressure wristband).  Go to a doctor that puts thin needles into certain body points (acupuncture) to improve how you feel.  Do not smoke.  Use a humidifier to keep the air in your house free of odors.  Get lots of fresh air. GET HELP IF:  You need medicine to feel better.  You feel dizzy or lightheaded.  You are losing weight. GET HELP RIGHT AWAY IF:   You feel very sick to your stomach and cannot stop throwing up.  You pass out (faint). MAKE SURE YOU:  Understand these instructions.  Will watch your condition.  Will get help right away if you are not doing well or get worse.   This information is not intended to replace advice given to you by your health care provider. Make sure you discuss any questions you have with your health care provider.   Document Released: 03/06/2004  Document Revised: 02/17/2014 Document Reviewed: 07/14/2012 Elsevier Interactive Patient Education 2016 [employer].  Call your doctor for pain, vaginal bleeding or discharge, increased nausea or inability to keep down food or fluids, temperature above 100.4, contractions or abdominal cramping, depression, or concerns.  No strenuous activity or heavy lifting.  No intercourse, tampons, douching, or enemas.  No tub baths-showers only.  No driving for 2 weeks.  Continue prenatal vitamin and iron.  Eat a bland diet, with small frequent amounts.

## 2015-08-31 NOTE — Progress Notes (Signed)
Discharge instructions provided.  Pt and sig other verbalize understanding of all instructions and follow-up care.  Prescriptions given.  Pt discharged to home at 1210 on 08/31/15 via wheelchair by RN. Reed Breech, RN 08/31/2015 12:23 PM

## 2015-08-31 NOTE — Discharge Summary (Signed)
Antenatal Physician Discharge Summary  Patient ID: Susan [name] MRN: [id number] DOB/AGE: 15-Oct-1985 30 y.o.  Admit date: 08/30/2015 Discharge date: 08/31/2015  Admission Diagnoses: intrauterine pregnancy at 6weeks 0 days, hyperemesis gravidarum   Discharge Diagnoses: same  Prenatal Procedures: ultrasound  Consults: none  Significant Diagnostic Studies:  Results for orders placed or performed during the hospital encounter of 08/30/15 (from the past 168 hour(s))  Lipase, blood   Collection Time: 08/30/15  9:57 AM  Result Value Ref Range   Lipase 16 11 - 51 U/L  Comprehensive metabolic panel   Collection Time: 08/30/15  9:57 AM  Result Value Ref Range   Sodium 136 135 - 145 mmol/L   Potassium 3.6 3.5 - 5.1 mmol/L   Chloride 105 101 - 111 mmol/L   CO2 23 22 - 32 mmol/L   Glucose, Bld 100 (H) 65 - 99 mg/dL   BUN 8 6 - 20 mg/dL   Creatinine, Ser 0.64 0.44 - 1.00 mg/dL   Calcium 9.3 8.9 - 10.3 mg/dL   Total Protein 7.3 6.5 - 8.1 g/dL   Albumin 4.3 3.5 - 5.0 g/dL   AST 18 15 - 41 U/L   ALT 13 (L) 14 - 54 U/L   Alkaline Phosphatase 53 38 - 126 U/L   Total Bilirubin 0.6 0.3 - 1.2 mg/dL   GFR calc non Af Amer >60 >60 mL/min   GFR calc Af Amer >60 >60 mL/min   Anion gap 8 5 - 15  CBC   Collection Time: 08/30/15  9:57 AM  Result Value Ref Range   WBC 7.9 3.6 - 11.0 K/uL   RBC 4.55 3.80 - 5.20 MIL/uL   Hemoglobin 13.3 12.0 - 16.0 g/dL   HCT 39.2 35.0 - 47.0 %   MCV 86.1 80.0 - 100.0 fL   MCH 29.3 26.0 - 34.0 pg   MCHC 34.0 32.0 - 36.0 g/dL   RDW 15.0 (H) 11.5 - 14.5 %   Platelets 217 150 - 440 K/uL  Urinalysis complete, with microscopic   Collection Time: 08/30/15  9:57 AM  Result Value Ref Range   Color, Urine AMBER (A) YELLOW   APPearance CLOUDY (A) CLEAR   Glucose, UA NEGATIVE NEGATIVE mg/dL   Bilirubin Urine NEGATIVE NEGATIVE   Ketones, ur NEGATIVE NEGATIVE mg/dL   Specific Gravity, Urine 1.025 1.005 - 1.030   Hgb urine dipstick NEGATIVE NEGATIVE   pH 8.0 5.0 -  8.0   Protein, ur 100 (A) NEGATIVE mg/dL   Nitrite NEGATIVE NEGATIVE   Leukocytes, UA 1+ (A) NEGATIVE   RBC / HPF 0-5 0 - 5 RBC/hpf   WBC, UA 0-5 0 - 5 WBC/hpf   Bacteria, UA RARE (A) NONE SEEN   Squamous Epithelial / LPF TOO NUMEROUS TO COUNT (A) NONE SEEN   Mucous PRESENT   hCG, quantitative, pregnancy   Collection Time: 08/30/15  9:57 AM  Result Value Ref Range   hCG, Beta Chain, Quant, S 48265 (H) <5 mIU/mL  TSH   Collection Time: 08/30/15  9:57 AM  Result Value Ref Range   TSH 0.803 0.350 - 4.500 uIU/mL  T4, free   Collection Time: 08/30/15  9:57 AM  Result Value Ref Range   Free T4 0.92 0.61 - 1.12 ng/dL  Pregnancy, urine POC   Collection Time: 08/30/15 10:03 AM  Result Value Ref Range   Preg Test, Ur POSITIVE (A) NEGATIVE    Treatments: IV fluids, IV vitamins, anti-emetics  Hospital Course:  This is a 30  y.o. [id number] with IUP at 6weeks 1 day, admitted for nausea and vomiting. She was given IV fluids and several anti-emetics and tolerated a clear diet without vomiting. She was deemed stable for discharge to home with outpatient follow up.  Discharge Physical Exam:  BP 111/69 mmHg  Pulse 73  Temp(Src) 99.1 F (37.3 C) (Oral)  Resp 20  Ht 5\' 7"  (1.702 m)  Wt 95.709 kg (211 lb)  BMI 33.04 kg/m2  SpO2 100%  LMP 09/07/2014 (Exact Date)  General: NAD CV: RRR Pulm: CTABL, nl effort ABD: s/nd/nt DVT Evaluation: LE non-ttp, no evidence of DVT on exam.   Discharge Condition: Stable  Disposition:  to home     Medication List    TAKE these medications        DULoxetine 60 MG capsule  Commonly known as:  CYMBALTA  Take 60 mg by mouth daily.     lamoTRIgine 100 MG tablet  Commonly known as:  LAMICTAL  Take 100 mg by mouth daily.     metroNIDAZOLE 500 MG tablet  Commonly known as:  FLAGYL  Take 500 mg by mouth 2 (two) times daily. For 7 days.     ondansetron 8 MG disintegrating tablet  Commonly known as:  ZOFRAN ODT  Take 1 tablet (8 mg total) by  mouth every 8 (eight) hours as needed for nausea or vomiting.     prochlorperazine 10 MG tablet  Commonly known as:  COMPAZINE  Take 1 tablet (10 mg total) by mouth every 6 (six) hours as needed for nausea or vomiting.     promethazine 12.5 MG suppository  Commonly known as:  PHENERGAN  Place 1 suppository (12.5 mg total) rectally every 6 (six) hours as needed for nausea or vomiting.     pyridOXINE 25 MG tablet  Commonly known as:  VITAMIN B-6  Take 1 tablet (25 mg total) by mouth 3 (three) times daily.     QUEtiapine 200 MG 24 hr tablet  Commonly known as:  SEROQUEL XR  Take 200 mg by mouth at bedtime.     thiamine 100 MG tablet  Take 1 tablet (100 mg total) by mouth daily.           Follow-up Information    Follow up with [facility] In 2 weeks.   Why:  for routine prenatal care   Contact information:   [place] [id number] [phone]       Signed: ----- Larey Days, MD Attending Obstetrician and Gynecologist [facility]

## 2015-09-01 LAB — URINE CULTURE: SPECIAL REQUESTS: NORMAL

## 2016-08-07 ENCOUNTER — Encounter: Payer: Self-pay | Admitting: Emergency Medicine

## 2016-08-07 ENCOUNTER — Emergency Department: Payer: Medicare Other

## 2016-08-07 ENCOUNTER — Emergency Department
Admission: EM | Admit: 2016-08-07 | Discharge: 2016-08-07 | Disposition: A | Discharge status: Home / Self Care | Payer: Medicare Other | Attending: Emergency Medicine | Admitting: Emergency Medicine

## 2016-08-07 DIAGNOSIS — R109 Unspecified abdominal pain: Secondary | ICD-10-CM | POA: Diagnosis present

## 2016-08-07 DIAGNOSIS — Z3A01 Less than 8 weeks gestation of pregnancy: Secondary | ICD-10-CM | POA: Diagnosis not present

## 2016-08-07 DIAGNOSIS — O26891 Other specified pregnancy related conditions, first trimester: Secondary | ICD-10-CM

## 2016-08-07 DIAGNOSIS — R102 Pelvic and perineal pain: Secondary | ICD-10-CM

## 2016-08-07 DIAGNOSIS — O9989 Other specified diseases and conditions complicating pregnancy, childbirth and the puerperium: Secondary | ICD-10-CM | POA: Diagnosis not present

## 2016-08-07 LAB — URINALYSIS, COMPLETE (UACMP) WITH MICROSCOPIC
BILIRUBIN URINE: NEGATIVE
Bacteria, UA: NONE SEEN
GLUCOSE, UA: NEGATIVE mg/dL
Hgb urine dipstick: NEGATIVE
KETONES UR: NEGATIVE mg/dL
LEUKOCYTES UA: NEGATIVE
Nitrite: NEGATIVE
PH: 5 (ref 5.0–8.0)
PROTEIN: 30 mg/dL — AB
Specific Gravity, Urine: 1.028 (ref 1.005–1.030)

## 2016-08-07 LAB — CBC
HCT: 37 % (ref 35.0–47.0)
HEMOGLOBIN: 12 g/dL (ref 12.0–16.0)
MCH: 27.4 pg (ref 26.0–34.0)
MCHC: 32.5 g/dL (ref 32.0–36.0)
MCV: 84.4 fL (ref 80.0–100.0)
PLATELETS: 248 10*3/uL (ref 150–440)
RBC: 4.39 MIL/uL (ref 3.80–5.20)
RDW: 15.4 % — ABNORMAL HIGH (ref 11.5–14.5)
WBC: 5.4 10*3/uL (ref 3.6–11.0)

## 2016-08-07 LAB — COMPREHENSIVE METABOLIC PANEL
ALT: 16 U/L (ref 14–54)
ANION GAP: 5 (ref 5–15)
AST: 22 U/L (ref 15–41)
Albumin: 4 g/dL (ref 3.5–5.0)
Alkaline Phosphatase: 50 U/L (ref 38–126)
BUN: 8 mg/dL (ref 6–20)
CHLORIDE: 105 mmol/L (ref 101–111)
CO2: 26 mmol/L (ref 22–32)
CREATININE: 0.68 mg/dL (ref 0.44–1.00)
Calcium: 8.6 mg/dL — ABNORMAL LOW (ref 8.9–10.3)
Glucose, Bld: 92 mg/dL (ref 65–99)
POTASSIUM: 3.7 mmol/L (ref 3.5–5.1)
SODIUM: 136 mmol/L (ref 135–145)
Total Bilirubin: 0.5 mg/dL (ref 0.3–1.2)
Total Protein: 7 g/dL (ref 6.5–8.1)

## 2016-08-07 LAB — ABO/RH: ABO/RH(D): O POS

## 2016-08-07 LAB — LIPASE, BLOOD: LIPASE: 23 U/L (ref 11–51)

## 2016-08-07 LAB — HCG, QUANTITATIVE, PREGNANCY: hCG, Beta Chain, Quant, S: 32923 m[IU]/mL — ABNORMAL HIGH (ref <5)

## 2016-08-07 NOTE — ED Notes (Signed)
ED Provider at bedside. 

## 2016-08-07 NOTE — ED Provider Notes (Signed)
[facility] Emergency Department Provider Note       Time seen: ----------------------------------------- 4:06 PM on 08/07/2016 -----------------------------------------     I have reviewed the triage vital signs and the nursing notes.   HISTORY   Chief Complaint Abdominal Pain    HPI Susan [name] is a 31 y.o. female who presents to the ED for possible pregnancy. Patient has an implant that needs to be checked, she has a 10 year breath control implant placed by [facility]. Patient went to her primary care doctor and found out she was pregnant. She's had some intermittent lower abdominal pain, one episode of vomiting but otherwise no symptoms. She is G7 P3 AB 3. She miscarried with 3 of her previous pregnancies, was spotting about a week and half ago. Pain is 4 out of 10 in the abdomen.   Past Medical History:  Diagnosis Date  . Depression   . Schizoaffective disorder [facility])     Patient Active Problem List   Diagnosis Date Noted  . Hyperemesis gravidarum before end of [redacted] week gestation, dehydration 08/30/2015  . Supervision of high risk pregnancy in first trimester 08/30/2015  . Hyperemesis gravidarum before end of [redacted] week gestation with dehydration 08/30/2015    Past Surgical History:  Procedure Laterality Date  . NO PAST SURGERIES      Allergies Patient has no known allergies.  Social History Social History  Substance Use Topics  . Smoking status: Never Smoker  . Smokeless tobacco: Not on file  . Alcohol use No    Review of Systems Constitutional: Negative for fever. Eyes: Negative for vision changes ENT:  Negative for congestion, sore throat Cardiovascular: Negative for chest pain. Respiratory: Negative for shortness of breath. Gastrointestinal: Positive for abdominal pain Genitourinary: Negative for dysuria. Musculoskeletal: Negative for back pain. Skin: Negative for rash. Neurological: Negative for headaches, focal  weakness or numbness.  All systems negative/normal/unremarkable except as stated in the HPI  ____________________________________________   PHYSICAL EXAM:  VITAL SIGNS: ED Triage Vitals [08/07/16 1330]  Enc Vitals Group     BP 127/77     Pulse Rate (!) 104     Resp 16     Temp 98.5 F (36.9 C)     Temp Source Oral     SpO2 100 %     Weight 195 lb (88.5 kg)     Height 5\' 8"  (1.727 m)     Head Circumference      Peak Flow      Pain Score 4     Pain Loc      Pain Edu?      Excl. in [place]?     Constitutional: Alert and oriented. Well appearing and in no distress. Eyes: Conjunctivae are normal. Normal extraocular movements. ENT   Head: Normocephalic and atraumatic.   Nose: No congestion/rhinnorhea.   Mouth/Throat: Mucous membranes are moist.   Neck: No stridor. Cardiovascular: Normal rate, regular rhythm. No murmurs, rubs, or gallops. Respiratory: Normal respiratory effort without tachypnea nor retractions. Breath sounds are clear and equal bilaterally. No wheezes/rales/rhonchi. Gastrointestinal: Soft and nontender. Normal bowel sounds Musculoskeletal: Nontender with normal range of motion in extremities. No lower extremity tenderness nor edema. Neurologic:  Normal speech and language. No gross focal neurologic deficits are appreciated.  Skin:  Skin is warm, dry and intact. No rash noted. Psychiatric: Mood and affect are normal. Speech and behavior are normal.  ____________________________________________  ED COURSE:  Pertinent labs & imaging results that were available  during my care of the patient were reviewed by me and considered in my medical decision making (see chart for details). Patient presents for abdominal pain and pregnancy, we will assess with labs and imaging as indicated.   Procedures ____________________________________________   LABS (pertinent positives/negatives)  Labs Reviewed  COMPREHENSIVE METABOLIC PANEL - Abnormal; Notable for the  following:       Result Value   Calcium 8.6 (*)    All other components within normal limits  CBC - Abnormal; Notable for the following:    RDW 15.4 (*)    All other components within normal limits  URINALYSIS, COMPLETE (UACMP) WITH MICROSCOPIC - Abnormal; Notable for the following:    Color, Urine YELLOW (*)    APPearance CLEAR (*)    Protein, ur 30 (*)    Squamous Epithelial / LPF 0-5 (*)    All other components within normal limits  HCG, QUANTITATIVE, PREGNANCY - Abnormal; Notable for the following:    hCG, Beta Chain, Quant, S 32,923 (*)    All other components within normal limits  LIPASE, BLOOD  ABO/RH    RADIOLOGY  Pregnancy ultrasound IMPRESSION: 1. Single intrauterine gestational sac with yolk sac at 6 weeks 2 days by mean sac diameter. No embryo visualized at this time, which could be due to early gestational age. Recommend follow-up US in 11-14 days for definitive diagnosis. This recommendation follows SRU consensus guidelines: Diagnostic Criteria for Nonviable Pregnancy Early in the First Trimester. [place] Med 2013; 115:7262-03. 2. No evidence of an IUD within the uterine cavity, cervix or adnexal regions. 3. No ovarian or adnexal abnormalities. ____________________________________________  FINAL ASSESSMENT AND PLAN  Abdominal pain and pregnancy  Plan: Patient's labs and imaging were dictated above. Patient had presented for Abdominal pain during pregnancy. She appears be 6 weeks and 2 days pregnant with no signs of breath control. She is stable for outpatient follow-up with GYN.   Earleen Newport, MD   Note: This note was generated in part or whole with voice recognition software. Voice recognition is usually quite accurate but there are transcription errors that can and very often do occur. I apologize for any typographical errors that were not detected and corrected.     Earleen Newport, MD 08/07/16 [phone]

## 2016-08-07 NOTE — ED Notes (Signed)
Pt reports she is pregnant and is on birth control.  Pt has left lower abd pain. No vag bleeding no dysuria.  Pt reports left lower back pain.  Pt vomited x 1.  [id number]

## 2016-08-07 NOTE — ED Triage Notes (Addendum)
Pt here to have implant checked.  Has 10 year birth control implant (this is first year) and went to PCP and found out was pregnant.  Sent over to have implant location checked and pt has had some mild intermittent LLQ pain. One episode vomiting but no other sx. [id number]. Miscarried with 3 of previous pregnancy.  Was spotting about 1.5 week ago.

## 2016-08-12 ENCOUNTER — Telehealth: Payer: Self-pay | Admitting: Obstetrics & Gynecology

## 2016-08-28 ENCOUNTER — Encounter: Payer: Self-pay | Admitting: Advanced Practice Midwife

## 2016-08-29 ENCOUNTER — Encounter: Payer: Self-pay | Admitting: Advanced Practice Midwife

## 2016-09-04 ENCOUNTER — Ambulatory Visit (INDEPENDENT_AMBULATORY_CARE_PROVIDER_SITE_OTHER): Payer: Medicare Other | Admitting: Obstetrics & Gynecology

## 2016-09-04 ENCOUNTER — Encounter: Payer: Self-pay | Admitting: Obstetrics & Gynecology

## 2016-09-04 VITALS — BP 110/60 | HR 88 | Wt 198.0 lb

## 2016-09-04 DIAGNOSIS — O9989 Other specified diseases and conditions complicating pregnancy, childbirth and the puerperium: Secondary | ICD-10-CM

## 2016-09-04 DIAGNOSIS — O0990 Supervision of high risk pregnancy, unspecified, unspecified trimester: Secondary | ICD-10-CM | POA: Insufficient documentation

## 2016-09-04 DIAGNOSIS — R8271 Bacteriuria: Secondary | ICD-10-CM

## 2016-09-04 DIAGNOSIS — Z3490 Encounter for supervision of normal pregnancy, unspecified, unspecified trimester: Secondary | ICD-10-CM

## 2016-09-04 DIAGNOSIS — Z3A10 10 weeks gestation of pregnancy: Secondary | ICD-10-CM

## 2016-09-04 DIAGNOSIS — O99891 Other specified diseases and conditions complicating pregnancy: Secondary | ICD-10-CM

## 2016-09-04 NOTE — Progress Notes (Signed)
09/04/2016   Chief Complaint: Missed period  Transfer of Care Patient: no  History of Present Illness: Ms. Lyall is a 31 y.o. [id number] Unknown based on No LMP recorded (lmp unknown). Patient is pregnant. with an Estimated Date of Delivery: None noted., with the above CC. Pt had IUD placed in [facility], never noticed expulsion.  [place] in ER last month showed 6 week pregnancy w [facility] 03/31/16 and no IUD.  Her periods were: irreg She was using IUD when she conceived.  She has Positive signs or symptoms of nausea/vomiting of pregnancy. She has Negative signs or symptoms of miscarriage or preterm labor She identifies Negative Zika risk factors for her and her partner On any different medications around the time she conceived/early pregnancy: No  History of varicella: Yes   ROS: A 12-point review of systems was performed and negative, except as stated in the above HPI.  OBGYN History: As per HPI. OB History  Gravida Para Term Preterm AB Living  5 3 2 1 1 3   SAB TAB Ectopic Multiple Live Births  1       3    # Outcome Date GA Lbr Len/2nd Weight Sex Delivery Anes PTL Lv  5 Current           4 Term 10/30/13    M Vag-Spont   LIV  3 Term 10/31/10    F Vag-Spont   LIV  2 SAB 08/29/09          1 Preterm 06/29/08    F Vag-Spont   LIV     Complications: Oligohydramnios      Any issues with any prior pregnancies: no Any prior children are healthy, doing well, without any problems or issues: yes History of pap smears: Yes. Last pap smear . Abnormal: no  History of STIs: No   Past Medical History: Past Medical History:  Diagnosis Date  . Depression   . Schizoaffective disorder [facility])     Past Surgical History: Past Surgical History:  Procedure Laterality Date  . NO PAST SURGERIES      Family History:  Family History  Problem Relation Age of Onset  . Diabetes type II Mother   . Depression Mother   . Hypertension Paternal Grandmother   . Diabetes type II Paternal Grandmother     She denies any female cancers, bleeding or blood clotting disorders.  She denies any history of mental retardation, birth defects or genetic disorders in her or the FOB's history  Social History:  Social History   Social History  . Marital status: Single    Spouse name: N/A  . Number of children: N/A  . Years of education: N/A   Occupational History  . Not on file.   Social History Main Topics  . Smoking status: Never Smoker  . Smokeless tobacco: Not on file  . Alcohol use No  . Drug use: No  . Sexual activity: Yes   Other Topics Concern  . Not on file   Social History Narrative  . No narrative on file   Any pets in the household: no  Allergy: No Known Allergies  Current Outpatient Medications:  Current Outpatient Prescriptions:  .  DULoxetine (CYMBALTA) 60 MG capsule, Take 60 mg by mouth daily., Disp: , Rfl:  .  lamoTRIgine (LAMICTAL) 100 MG tablet, Take 100 mg by mouth daily., Disp: , Rfl:  .  metroNIDAZOLE (FLAGYL) 500 MG tablet, Take 500 mg by mouth 2 (two) times daily. For 7 days., Disp: ,  Rfl:  .  ondansetron (ZOFRAN ODT) 8 MG disintegrating tablet, Take 1 tablet (8 mg total) by mouth every 8 (eight) hours as needed for nausea or vomiting., Disp: 90 tablet, Rfl: 2 .  prochlorperazine (COMPAZINE) 10 MG tablet, Take 1 tablet (10 mg total) by mouth every 6 (six) hours as needed for nausea or vomiting., Disp: 60 tablet, Rfl: 1 .  promethazine (PHENERGAN) 12.5 MG suppository, Place 1 suppository (12.5 mg total) rectally every 6 (six) hours as needed for nausea or vomiting., Disp: 30 each, Rfl: 2 .  pyridOXINE (VITAMIN B-6) 25 MG tablet, Take 1 tablet (25 mg total) by mouth 3 (three) times daily., Disp: 90 tablet, Rfl: 2 .  QUEtiapine (SEROQUEL XR) 200 MG 24 hr tablet, Take 200 mg by mouth at bedtime., Disp: , Rfl:  .  thiamine 100 MG tablet, Take 1 tablet (100 mg total) by mouth daily., Disp: 30 tablet, Rfl: 2   Physical Exam:   BP 110/60   Pulse 88   Wt 198 lb  (89.8 kg)   LMP  (LMP Unknown) Comment: periods irregluar per pt, unsure last  BMI 30.11 kg/m  Body mass index is 30.11 kg/m. Constitutional: Well nourished, well developed female in no acute distress.  Neck:  Supple, normal appearance, and no thyromegaly  Cardiovascular: S1, S2 normal, no murmur, rub or gallop, regular rate and rhythm Respiratory:  Clear to auscultation bilateral. Normal respiratory effort Abdomen: positive bowel sounds and no masses, hernias; diffusely non tender to palpation, non distended Breasts: breasts appear normal, no suspicious masses, no skin or nipple changes or axillary nodes. Neuro/Psych:  Normal mood and affect.  Skin:  Warm and dry.  Lymphatic:  No inguinal lymphadenopathy.   Pelvic exam: is not limited by body habitus EGBUS: within normal limits, Vagina: within normal limits and with no blood in the vault, Cervix: normal appearing cervix without discharge or lesions, closed/long/high, Uterus:  enlarged: 12 weeks, and Adnexa:  normal adnexa  Assessment: Ms. Minetti is a 31 y.o. [id number] Unknown based on No LMP recorded (lmp unknown). Patient is pregnant. with an Estimated Date of Delivery: None noted.,  for prenatal care.  Plan:  1) Avoid alcoholic beverages. 2) Patient encouraged not to smoke.  3) Discontinue the use of all non-medicinal drugs and chemicals.  4) Take prenatal vitamins daily.  5) Seatbelt use advised 6) Nutrition, food safety (fish, cheese advisories, and high nitrite foods) and exercise discussed. 7) Hospital and practice style delivering at [facility] discussed  8) Patient is asked about travel to areas at risk for the [place] virus, and counseled to avoid travel and exposure to mosquitoes or sexual partners who may have themselves been exposed to the virus. Testing is discussed, and will be ordered as appropriate.  9) Childbirth classes at [facility] advised 10) Genetic Screening, such as with 1st Trimester Screening, cell free fetal DNA, AFP  testing, and Ultrasound, as well as with amniocentesis and CVS as appropriate, is discussed with patient. She plans to have not genetic testing this pregnancy. 11) [place] - irreg periods and prior IUD (never noticed expulsion) 12) plans BTL  Problem list reviewed and updated.  Barnett Applebaum, MD, [facility], [facility] 09/04/2016  10:14 AM

## 2016-09-04 NOTE — Patient Instructions (Signed)

## 2016-09-06 LAB — URINE CULTURE: ORGANISM ID, BACTERIA: NO GROWTH

## 2016-09-08 LAB — IGP,CTNGTV,RFX APTIMA HPV ASCU
Chlamydia, Nuc. Acid Amp: NEGATIVE
Gonococcus, Nuc. Acid Amp: NEGATIVE
PAP SMEAR COMMENT: 0
Trich vag by NAA: NEGATIVE

## 2016-09-08 LAB — RPR+RH+ABO+RUB AB+AB SCR+CB...
Antibody Screen: NEGATIVE
HEP B S AG: NEGATIVE
HIV Screen 4th Generation wRfx: NONREACTIVE
Hematocrit: 37.4 % (ref 34.0–46.6)
Hemoglobin: 12.4 g/dL (ref 11.1–15.9)
MCH: 28.1 pg (ref 26.6–33.0)
MCHC: 33.2 g/dL (ref 31.5–35.7)
MCV: 85 fL (ref 79–97)
Platelets: 240 10*3/uL (ref 150–379)
RBC: 4.42 x10E6/uL (ref 3.77–5.28)
RDW: 15.9 % — ABNORMAL HIGH (ref 12.3–15.4)
RPR: NONREACTIVE
Rh Factor: POSITIVE
Rubella Antibodies, IGG: 3.81 index (ref >0.99)
VARICELLA: 1021 {index} (ref >165)
WBC: 7.2 10*3/uL (ref 3.4–10.8)

## 2016-09-08 LAB — HEMOGLOBINOPATHY EVALUATION
HEMOGLOBIN A2 QUANTITATION: 2.3 % (ref 1.8–3.2)
HGB A: 97.7 % (ref 96.4–98.8)
HGB C: 0 %
HGB S: 0 %
HGB VARIANT: 0 %
Hemoglobin F Quantitation: 0 % (ref 0.0–2.0)

## 2016-09-16 ENCOUNTER — Other Ambulatory Visit: Payer: Medicare Other

## 2016-09-16 ENCOUNTER — Telehealth: Payer: Self-pay | Admitting: Obstetrics & Gynecology

## 2016-09-16 ENCOUNTER — Other Ambulatory Visit: Payer: Self-pay | Admitting: Obstetrics & Gynecology

## 2016-09-16 ENCOUNTER — Encounter: Payer: Medicare Other | Admitting: Advanced Practice Midwife

## 2016-09-16 MED ORDER — PROMETHAZINE HCL 12.5 MG RE SUPP: 12.5000 mg | Freq: Four times a day (QID) | RECTAL | 2 refills | Status: DC | PRN

## 2016-09-16 MED ORDER — ONDANSETRON 8 MG PO TBDP: 8.0000 mg | ORAL_TABLET | Freq: Three times a day (TID) | ORAL | 2 refills | Status: DC | PRN

## 2016-09-16 NOTE — Telephone Encounter (Signed)
Pt is calling needing an prescription for nausea. Please advise . [facility] on [address]

## 2016-09-16 NOTE — Telephone Encounter (Signed)
Left pt msg rx sent in

## 2016-09-16 NOTE — Telephone Encounter (Signed)
Pt missed appt this morning for ultrasound and came for follow up but was rescheduled for Thursday. Pt was last seen by [facility] for ER f/u. Please send rx if possible. Thank you.

## 2016-09-16 NOTE — Telephone Encounter (Signed)
Zofran eRx done, let her know to get

## 2016-09-18 ENCOUNTER — Other Ambulatory Visit: Payer: Medicare Other

## 2016-09-18 ENCOUNTER — Encounter: Payer: Medicare Other | Admitting: Obstetrics & Gynecology

## 2016-09-26 ENCOUNTER — Encounter: Payer: Medicare Other | Admitting: Obstetrics and Gynecology

## 2016-09-26 ENCOUNTER — Other Ambulatory Visit: Payer: Medicare Other

## 2016-10-07 ENCOUNTER — Other Ambulatory Visit: Payer: Self-pay | Admitting: Nurse Practitioner

## 2016-10-07 DIAGNOSIS — O3680X0 Pregnancy with inconclusive fetal viability, not applicable or unspecified: Secondary | ICD-10-CM

## 2016-10-09 ENCOUNTER — Ambulatory Visit
Admission: RE | Admit: 2016-10-09 | Discharge: 2016-10-09 | Disposition: A | Discharge status: Home / Self Care | Payer: Medicare Other | Source: Ambulatory Visit | Attending: Nurse Practitioner | Admitting: Nurse Practitioner

## 2016-10-09 DIAGNOSIS — Z3A15 15 weeks gestation of pregnancy: Secondary | ICD-10-CM | POA: Insufficient documentation

## 2016-10-09 DIAGNOSIS — O3680X0 Pregnancy with inconclusive fetal viability, not applicable or unspecified: Secondary | ICD-10-CM | POA: Diagnosis present

## 2016-10-21 ENCOUNTER — Emergency Department
Admission: EM | Admit: 2016-10-21 | Discharge: 2016-10-22 | Disposition: A | Discharge status: Home / Self Care | Attending: Emergency Medicine | Admitting: Emergency Medicine

## 2016-10-21 ENCOUNTER — Encounter: Payer: Self-pay | Admitting: Emergency Medicine

## 2016-10-21 DIAGNOSIS — R000 Tachycardia, unspecified: Secondary | ICD-10-CM | POA: Insufficient documentation

## 2016-10-21 DIAGNOSIS — O9A219 Injury, poisoning and certain other consequences of external causes complicating pregnancy, unspecified trimester: Secondary | ICD-10-CM | POA: Diagnosis present

## 2016-10-21 DIAGNOSIS — Z79899 Other long term (current) drug therapy: Secondary | ICD-10-CM | POA: Diagnosis not present

## 2016-10-21 DIAGNOSIS — O99340 Other mental disorders complicating pregnancy, unspecified trimester: Secondary | ICD-10-CM | POA: Insufficient documentation

## 2016-10-21 DIAGNOSIS — Z3A00 Weeks of gestation of pregnancy not specified: Secondary | ICD-10-CM | POA: Insufficient documentation

## 2016-10-21 DIAGNOSIS — R440 Auditory hallucinations: Secondary | ICD-10-CM | POA: Diagnosis not present

## 2016-10-21 DIAGNOSIS — R45851 Suicidal ideations: Secondary | ICD-10-CM

## 2016-10-21 DIAGNOSIS — T447X2A Poisoning by beta-adrenoreceptor antagonists, intentional self-harm, initial encounter: Secondary | ICD-10-CM | POA: Diagnosis not present

## 2016-10-21 DIAGNOSIS — F251 Schizoaffective disorder, depressive type: Secondary | ICD-10-CM | POA: Diagnosis not present

## 2016-10-21 DIAGNOSIS — Z3490 Encounter for supervision of normal pregnancy, unspecified, unspecified trimester: Secondary | ICD-10-CM

## 2016-10-21 DIAGNOSIS — T1491XA Suicide attempt, initial encounter: Secondary | ICD-10-CM

## 2016-10-21 DIAGNOSIS — F259 Schizoaffective disorder, unspecified: Secondary | ICD-10-CM | POA: Insufficient documentation

## 2016-10-21 LAB — COMPREHENSIVE METABOLIC PANEL
ALK PHOS: 42 U/L (ref 38–126)
ALT: 22 U/L (ref 14–54)
AST: 33 U/L (ref 15–41)
Albumin: 3.7 g/dL (ref 3.5–5.0)
Anion gap: 10 (ref 5–15)
BILIRUBIN TOTAL: 0.7 mg/dL (ref 0.3–1.2)
BUN: 10 mg/dL (ref 6–20)
CALCIUM: 8.9 mg/dL (ref 8.9–10.3)
CHLORIDE: 106 mmol/L (ref 101–111)
CO2: 19 mmol/L — ABNORMAL LOW (ref 22–32)
CREATININE: 0.54 mg/dL (ref 0.44–1.00)
Glucose, Bld: 93 mg/dL (ref 65–99)
Potassium: 3.9 mmol/L (ref 3.5–5.1)
Sodium: 135 mmol/L (ref 135–145)
Total Protein: 7.2 g/dL (ref 6.5–8.1)

## 2016-10-21 LAB — ACETAMINOPHEN LEVEL: Acetaminophen (Tylenol), Serum: <10 ug/mL — ABNORMAL LOW (ref 10–30)

## 2016-10-21 LAB — CBC
HCT: 36.3 % (ref 35.0–47.0)
Hemoglobin: 12.1 g/dL (ref 12.0–16.0)
MCH: 28.5 pg (ref 26.0–34.0)
MCHC: 33.3 g/dL (ref 32.0–36.0)
MCV: 85.7 fL (ref 80.0–100.0)
PLATELETS: 265 10*3/uL (ref 150–440)
RBC: 4.24 MIL/uL (ref 3.80–5.20)
RDW: 14.2 % (ref 11.5–14.5)
WBC: 9.1 10*3/uL (ref 3.6–11.0)

## 2016-10-21 LAB — HCG, QUANTITATIVE, PREGNANCY: hCG, Beta Chain, Quant, S: 78224 m[IU]/mL — ABNORMAL HIGH (ref <5)

## 2016-10-21 LAB — ETHANOL

## 2016-10-21 LAB — SALICYLATE LEVEL

## 2016-10-21 MED ORDER — LORAZEPAM 2 MG/ML IJ SOLN
2.0000 mg | Freq: Once | INTRAMUSCULAR | Status: DC
  Filled 2016-10-21: qty 1

## 2016-10-21 MED ORDER — DIPHENHYDRAMINE HCL 50 MG/ML IJ SOLN
50.0000 mg | Freq: Once | INTRAMUSCULAR | Status: AC
  Administered 2016-10-21: 25 mg via INTRAMUSCULAR

## 2016-10-21 MED ORDER — DIPHENHYDRAMINE HCL 50 MG/ML IJ SOLN
INTRAMUSCULAR | Status: AC
  Administered 2016-10-21: 25 mg via INTRAMUSCULAR
  Filled 2016-10-21: qty 1

## 2016-10-21 MED ORDER — SODIUM CHLORIDE 0.9 % IV BOLUS (SEPSIS)
1000.0000 mL | Freq: Once | INTRAVENOUS | Status: AC
  Administered 2016-10-21: 1000 mL via INTRAVENOUS

## 2016-10-21 MED ORDER — OLANZAPINE 5 MG PO TBDP: 10.0000 mg | ORAL_TABLET | Freq: Every day | ORAL | Status: DC

## 2016-10-21 MED ORDER — SODIUM CHLORIDE 0.9 % IV SOLN
Freq: Once | INTRAVENOUS | Status: AC
  Administered 2016-10-21: 17:00:00 via INTRAVENOUS

## 2016-10-21 MED ORDER — HALOPERIDOL LACTATE 5 MG/ML IJ SOLN
INTRAMUSCULAR | Status: AC
  Administered 2016-10-21: 5 mg via INTRAMUSCULAR
  Filled 2016-10-21: qty 1

## 2016-10-21 MED ORDER — HALOPERIDOL LACTATE 5 MG/ML IJ SOLN
5.0000 mg | Freq: Once | INTRAMUSCULAR | Status: AC
  Administered 2016-10-21: 5 mg via INTRAMUSCULAR

## 2016-10-21 MED ORDER — QUETIAPINE FUMARATE 200 MG PO TABS
200.0000 mg | ORAL_TABLET | Freq: Every day | ORAL | Status: DC
  Administered 2016-10-21: 200 mg via ORAL
  Filled 2016-10-21: qty 1

## 2016-10-21 NOTE — ED Notes (Signed)
No breaks noted in pts skin around wrists, movement and sensation remain intact bilaterally. Pt remains in custody and police remain at bedside. No color changes and no pain reported by pt. No signs of distress noted at this time

## 2016-10-21 NOTE — ED Notes (Signed)
Pt requesting meal. Pt did not receive dinner tray before now. Pt given a [place] sandwich tray. Pt calm and polite. Officer has taken handcuff off pts wrist but remains in the room with pt.

## 2016-10-21 NOTE — ED Notes (Signed)
Report given to [facility] in [employer]

## 2016-10-21 NOTE — ED Notes (Signed)
Poison control called to check on pt.

## 2016-10-21 NOTE — BH Assessment (Signed)
Assessment Note  Susan [name] is an 31 y.o. female who presents to the ER from the jail, after ingesting an unknown amount of cleaning solution. Per the officer who was accompanying the patient, she was brought to jail due to charges of child abuse. While in the jail, she hasn't had her antipsychotic medications because she's pregnant. Since then, she's voiced SI and responding to internal stimuli.   While in the patients room, this writer witnessed the patient responding to internal stimuli. Information for this assessment was gathered by the officer who was with the patient. Patient speech was incoherent and word salad.  Diagnosis: Schizoaffective Disorder  Past Medical History:  Past Medical History:  Diagnosis Date  . Depression   . Schizoaffective disorder [facility])     Past Surgical History:  Procedure Laterality Date  . NO PAST SURGERIES      Family History:  Family History  Problem Relation Age of Onset  . Diabetes type II Mother   . Depression Mother   . Hypertension Paternal Grandmother   . Diabetes type II Paternal Grandmother     Social History:  reports that she has never smoked. She has never used smokeless tobacco. She reports that she does not drink alcohol or use drugs.  Additional Social History:  Alcohol / Drug Use Pain Medications: See PTA Prescriptions: See PTA Over the Counter: See PTA History of alcohol / drug use?:  (Unable to quantify) Longest period of sobriety (when/how long): Unable to quantify Negative Consequences of Use:  (n/a) Withdrawal Symptoms:  (n/a)  CIWA: CIWA-Ar BP: 95/60 Pulse Rate: 88 COWS:    Allergies: No Known Allergies  Home Medications:  (Not in a hospital admission)  OB/GYN Status:  Patient's last menstrual period was 10/21/2016.  General Assessment Data Assessment unable to be completed: Yes Location of Assessment: [facility] ED TTS Assessment: In system Is this a Tele or Face-to-Face Assessment?: Face-to-Face Is this  an Initial Assessment or a Re-assessment for this encounter?: Initial Assessment Marital status: Single Maiden name: n/a Is patient pregnant?: No Pregnancy Status: No Living Arrangements: Other (Comment) (Jail) Can pt return to current living arrangement?: Yes Admission Status: Involuntary Is patient capable of signing voluntary admission?: No Referral Source: Other (From jail) Insurance type: n/a  Medical Screening Exam ([place]) Medical Exam completed: Yes  Crisis Care Plan Living Arrangements: Other (Comment) Futures trader) Legal Guardian: Other: (Self) Name of Psychiatrist: Reports of none Name of Therapist: Reports of none  Education Status Is patient currently in school?: No Current Grade: n/a Highest grade of school patient has completed: n/a Name of school: n/a Contact person: n/a  Risk to self with the past 6 months Suicidal Ideation: Yes-Currently Present (Per [employer]. Jailer/officer ) Has patient been a risk to self within the past 6 months prior to admission? : Yes Suicidal Intent: Yes-Currently Present Has patient had any suicidal intent within the past 6 months prior to admission? : Yes Is patient at risk for suicide?: Yes Suicidal Plan?: Yes-Currently Present Has patient had any suicidal plan within the past 6 months prior to admission? : Yes Specify Current Suicidal Plan: Tried to overdose while in  (Per [employer]. Jailer/officer) Access to Means: Yes Specify Access to Suicidal Means: While in jail What has been your use of drugs/alcohol within the last 12 months?: Unknown Previous Attempts/Gestures:  (UTA-Patient did not talk with Probation officer) How many times?:  (UTA-Patient did not talk with Probation officer) Other Self Harm Risks: UTA-Patient did not  talk with Probation officer Triggers for Past Attempts: Unknown Family Suicide History: Unknown, Unable to assess Recent stressful life event(s): Other (Comment) (UTA-Patient did not talk with  Probation officer) Persecutory voices/beliefs?:  (UTA-Patient did not talk with Probation officer) Depression: Yes Depression Symptoms: Tearfulness, Conservation officer, nature (Per [employer]. Jailer/officer) Substance abuse history and/or treatment for substance abuse?:  (UTA-Patient did not talk with Probation officer  ) Suicide prevention information given to non-admitted patients: Not applicable  Risk to Others within the past 6 months Homicidal Ideation:  (UTA-Patient did not talk with Probation officer  ) Does patient have any lifetime risk of violence toward others beyond the six months prior to admission? : Unknown Thoughts of Harm to Others:  (UTA-Patient did not talk with Probation officer  ) Current Homicidal Intent:  (UTA-Patient did not talk with Probation officer  ) Current Homicidal Plan:  (UTA-Patient did not talk with Probation officer  ) Access to Homicidal Means:  (UTA-Patient did not talk with Probation officer  ) Identified Victim: UTA-Patient did not talk with Probation officer   History of harm to others?:  (UTA-Patient did not talk with Probation officer  ) Assessment of Violence:  (UTA-Patient did not talk with Probation officer  ) Violent Behavior Description: UTA-Patient did not talk with Probation officer   Does patient have access to weapons?:  (UTA-Patient did not talk with Probation officer  ) Criminal Charges Pending?: Yes Describe Pending Criminal Charges: Child Abuse Does patient have a court date: No Is patient on probation?: No  Psychosis Hallucinations: Auditory Delusions:  (UTA-Patient did not talk with Probation officer  )  Mental Status Report Appearance/Hygiene: Bizarre, Other (Comment) (Clothes issued by the [facility]) Eye Contact: Poor Motor Activity: Unable to assess Speech: Incoherent, Word salad Level of Consciousness: Restless Mood: Anxious, Suspicious Affect: Frightened, Preoccupied Anxiety Level: Minimal Thought Processes: Flight of Ideas, Thought Blocking Judgement: Impaired Orientation: Unable to assess (UTA-Patient did not talk with Probation officer ) Obsessive Compulsive  Thoughts/Behaviors: Unable to Assess  Cognitive Functioning Concentration: Unable to Assess Memory: Unable to Assess IQ:  (UTA-Patient did not talk with Probation officer  ) Insight: Poor Impulse Control: Poor Appetite:  (UTA-Patient did not talk with Probation officer  ) Sleep: Unable to Assess Total Hours of Sleep:  (UTA-Patient did not talk with Probation officer  ) Vegetative Symptoms: Unable to Assess  ADLScreening [facility]) Patient's cognitive ability adequate to safely complete daily activities?:  (UTA-Patient psychotic) Patient able to express need for assistance with ADLs?:  (UTA-Patient psychotic) Independently performs ADLs?:  (UTA-Patient psychotic)  Prior Inpatient Therapy Prior Inpatient Therapy:  (UTA-Patient did not talk with Probation officer  ) Prior Therapy Dates: UTA-Patient did not talk with Probation officer   Prior Therapy Facilty/Provider(s): UTA-Patient did not talk with Probation officer   Reason for Treatment: UTA-Patient did not talk with Probation officer    Prior Outpatient Therapy Prior Outpatient Therapy:  (UTA-Patient did not talk with Probation officer  ) Prior Therapy Dates: UTA-Patient did not talk with Probation officer   Prior Therapy Facilty/Provider(s): UTA-Patient did not talk with Probation officer   Reason for Treatment: UTA-Patient did not talk with Probation officer   Does patient have an ACCT team?: Unknown Does patient have Intensive In-House Services?  : Unknown Does patient have [place] services? : Unknown Does patient have P4CC services?: Unknown  ADL Screening (condition at time of admission) Patient's cognitive ability adequate to safely complete daily activities?:  (UTA-Patient psychotic) Is the patient deaf or have difficulty hearing?:  (UTA-Patient psychotic) Does the patient have difficulty seeing, even when wearing glasses/contacts?:  (UTA-Patient psychotic) Does the patient have difficulty concentrating, remembering,  or making decisions?:  (UTA-Patient psychotic) Patient able to express need for assistance with ADLs?:   (UTA-Patient psychotic) Does the patient have difficulty dressing or bathing?:  (UTA-Patient psychotic) Independently performs ADLs?:  (UTA-Patient psychotic) Does the patient have difficulty walking or climbing stairs?:  (UTA-Patient psychotic) Weakness of Legs:  (UTA-Patient psychotic) Weakness of Arms/Hands:  (UTA-Patient psychotic)  Home Assistive Devices/Equipment Home Assistive Devices/Equipment:  (UTA-Patient psychotic)  Therapy Consults (therapy consults require a physician order) PT Evaluation Needed:  (UTA-Patient psychotic) OT Evalulation Needed:  (UTA-Patient psychotic) SLP Evaluation Needed:  (UTA-Patient psychotic) Abuse/Neglect Assessment (Assessment to be complete while patient is alone) Physical Abuse:  (UTA-Patient psychotic) Verbal Abuse:  (UTA-Patient psychotic) Sexual Abuse:  (UTA-Patient psychotic) Exploitation of patient/patient's resources:  (UTA-Patient psychotic) Self-Neglect:  (UTA-Patient psychotic) Possible abuse reported to::  (UTA-Patient psychotic) Values / Beliefs Cultural Requests During Hospitalization:  (UTA-Patient psychotic) Spiritual Requests During Hospitalization:  (UTA-Patient psychotic) Consults Spiritual Care Consult Needed:  (UTA-Patient psychotic) Social Work Consult Needed:  (UTA-Patient psychotic) Regulatory affairs officer (For Healthcare) Does Patient Have a Medical Advance Directive?: No Would patient like information on creating a medical advance directive?: Yes (Inpatient - patient requests chaplain consult to create a medical advance directive)    Additional Information 1:1 In Past 12 Months?: Yes CIRT Risk:  (UTA-Patient did not talk with Probation officer  ) Elopement Risk:  (UTA-Patient did not talk with Probation officer  )  Child/Adolescent Assessment Running Away Risk: Denies (Patient is an adult)  Disposition:  Disposition Initial Assessment Completed for this Encounter: Yes Disposition of Patient: Other dispositions (ER MD Ordered Psych  Consult)  On Site Evaluation by:   Reviewed with Physician:    Gunnar Fusi MS, LCAS, LPC, [place], CCSI Therapeutic Triage Specialist 10/21/2016 8:28 PM
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
## 2016-10-21 NOTE — ED Notes (Signed)
Pt is in the custody of the [facility] and is an inmate at the jail - she is accompanied by 2 officers at bedside and she is handcuffed in front of her abd - pt has wnl capillary refill - color perfusion to hands is normal - pt can turn wrist and move all fingers without difficulty
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
## 2016-10-21 NOTE — ED Provider Notes (Signed)
[facility] Emergency Department Provider Note  ____________________________________________  Time seen: Approximately 6:38 PM  I have reviewed the triage vital signs and the nursing notes.   HISTORY  Chief Complaint Ingestion    HPI Susan [name] is a 31 y.o. female with a history of schizoaffective disorder and depression, currently pregnant, presenting with hallucinations, anxiety, and overdose with a cleaning agent. The patient reports that prior to arriving here, she drank an unknown amount of cleaning agent while in jail. She has not had any vomiting. She reports a burning sensation in her mouth. She reports the voices told her to do it and that she is actively suicidal. She denies any homicidal ideations. She has not had any abdominal pain, vaginal bleeding, or fevers.   Past Medical History:  Diagnosis Date  . Depression   . Schizoaffective disorder [facility])     Patient Active Problem List   Diagnosis Date Noted  . Schizoaffective disorder, depressive type ([place]) 10/21/2016  . Suicide attempt ([place]) 10/21/2016  . Encounter for supervision of low-risk pregnancy, antepartum 09/04/2016  . Pregnant 08/30/2015    Past Surgical History:  Procedure Laterality Date  . NO PAST SURGERIES      Current Outpatient Rx  . Order #: [id number] Class: Historical Med  . Order #: [id number] Class: Historical Med  . Order #: [id number] Class: Historical Med  . Order #: [id number] Class: Print  . Order #: [id number] Class: Normal  . Order #: [id number] Class: Print  . Order #: [id number] Class: Print  . Order #: [id number] Class: Print    Allergies Patient has no known allergies.  Family History  Problem Relation Age of Onset  . Diabetes type II Mother   . Depression Mother   . Hypertension Paternal Grandmother   . Diabetes type II Paternal Grandmother     Social History Social History  Substance Use Topics  . Smoking status: Never Smoker  . Smokeless tobacco:  Never Used  . Alcohol use No    Review of Systems Constitutional: No fever/chills.No lightheadedness or syncope. Eyes: No visual changes. ENT: No sore throat. No congestion or rhinorrhea. Has a burning sensation in the mouth. Cardiovascular: Denies chest pain. Denies palpitations. Respiratory: Denies shortness of breath.  No cough. Gastrointestinal: No abdominal pain.  No nausea, no vomiting.  No diarrhea.  No constipation. Genitourinary: Negative for dysuria. Positive pregnancy. Lower abdominal pain, pelvic pain, or vaginal bleeding. Musculoskeletal: Negative for back pain. Skin: Negative for rash. Neurological: Negative for headaches. No focal numbness, tingling or weakness.  Psychiatric:Positive hallucinations. Positive SI. Positive overdose.    ____________________________________________   PHYSICAL EXAM:  VITAL SIGNS: ED Triage Vitals  Enc Vitals Group     BP 10/21/16 1654 119/65     Pulse Rate 10/21/16 1654 (!) 144     Resp 10/21/16 1654 (!) 22     Temp 10/21/16 1654 99.6 F (37.6 C)     Temp Source 10/21/16 1654 Oral     SpO2 10/21/16 1654 96 %     Weight 10/21/16 1654 200 lb (90.7 kg)     Height 10/21/16 1654 5\' 8"  (1.727 m)     Head Circumference --      Peak Flow --      Pain Score 10/21/16 1653 0     Pain Loc --      Pain Edu? --      Excl. in [place]? --     Constitutional: The patient is alert, oriented and answering questions. However, she is clearly responding to  internal stimulus. Eyes: Conjunctivae are normal.  EOMI. No scleral icterus. Head: Atraumatic. Nose: No congestion/rhinnorhea. There are no areas of burns or erythema around the naris. Mouth/Throat: Mucous membranes are moist. There is no evidence of burning. No posterior pharyngeal abnormalities. I do not see any abnormalities of the tongue, lips, buccal mucosa. The patient's voice is normal. She has no stridor or trismus. She is able to maintain her secretions appropriately. Neck: No stridor.   Supple.  No JVD. Cardiovascular: Normal rate, regular rhythm. No murmurs, rubs or gallops.  Respiratory: Normal respiratory effort.  No accessory muscle use or retractions. Lungs CTAB.  No wheezes, rales or ronchi. Gastrointestinal: Soft, nontender and nondistended.  No guarding or rebound.  No peritoneal signs. Musculoskeletal: No LE edema. Neurologic:  A&Ox3.  Speech is clear.  Face and smile are symmetric.  EOMI.  Moves all extremities well. Skin:  Skin is warm, dry and intact. No rash noted. Psychiatric: Mood and affect are normal. Speech and behavior are normal.  Normal judgement.  ____________________________________________   LABS (all labs ordered are listed, but only abnormal results are displayed)  Labs Reviewed  COMPREHENSIVE METABOLIC PANEL - Abnormal; Notable for the following:       Result Value   CO2 19 (*)    All other components within normal limits  ACETAMINOPHEN LEVEL - Abnormal; Notable for the following:    Acetaminophen (Tylenol), Serum <10 (*)    All other components within normal limits  HCG, QUANTITATIVE, PREGNANCY - Abnormal; Notable for the following:    hCG, Beta Chain, Quant, S 78,224 (*)    All other components within normal limits  ETHANOL  SALICYLATE LEVEL  CBC   ____________________________________________  EKG  ED ECG REPORT I, Eula Listen, the attending physician, personally viewed and interpreted this ECG.   Date: 10/21/2016  EKG Time: 1705  Rate: 140  Rhythm: sinus tachycardia  Axis: normal  Intervals:none  ST&T Change: Nonspecific T-wave inversion in V1. No STEMI.  ____________________________________________  RADIOLOGY  No results found.  ____________________________________________   PROCEDURES  Procedure(s) performed: None  Procedures  Critical Care performed: No ____________________________________________   INITIAL IMPRESSION / ASSESSMENT AND PLAN / ED COURSE  Pertinent labs & imaging results that  were available during my care of the patient were reviewed by me and considered in my medical decision making (see chart for details).  31 y.o. pregnant female with a history of depression and schizoaffective disorder presenting with hallucinations, SI, and overdose in the cleaning product. From a medical standpoint, the patient is tachycardiac and severely agitated. We have called poison control, who recommend evaluation for any intraoral burns, and a 6 hour observation period. At this time I will put the patient under involuntary commitment and she does meet criteria.  ----------------------------------------- 7:23 PM on 10/21/2016 -----------------------------------------  The patient's laboratory studies are reassuring. Her hCG is 78,000 and we'll obtain fetal heart tones but as the patient is not having any active symptoms related to the pregnancy, no further evaluation is required in the emergency department. The patient has been seen by the on-call psychiatrist, who recommends continued observation and reinitiation of the patient's medications, she has been off of them since she has been in jail. Once the patient completes her ED observation time, she will be medically cleared and evaluated for final psychiatric disposition.  ____________________________________________  FINAL CLINICAL IMPRESSION(S) / ED DIAGNOSES  Final diagnoses:  Intentional overdose of beta-adrenergic blocking drug, initial encounter ([place])  Suicidal ideation  Auditory hallucinations  NEW MEDICATIONS STARTED DURING THIS VISIT:  New Prescriptions   No medications on file      Eula Listen, MD 10/21/16 1924

## 2016-10-21 NOTE — ED Notes (Signed)
PT sleeping in bed at this time. Pt remains calm and cooperative. NAD noted. Radial pulses are intact and equal bilaterally. Ni breaks in the skin of pts wrist. Only left wrist is in handcuffs at this time and officers remain at bedside to monitor pt. No resp. Distress noted. No nausea or abd pain reported by pt. Voices is clear and intact. No neuro deficits noted. PT is alert and oriented x4.

## 2016-10-21 NOTE — ED Notes (Signed)
Officers in room with patient. Patient no longer handcuffed at this time. Patient wrists bilaterally do not have breaks in the skin. Pulses and sensation intact bilaterally.

## 2016-10-22 DIAGNOSIS — F251 Schizoaffective disorder, depressive type: Secondary | ICD-10-CM | POA: Diagnosis not present

## 2016-10-22 MED ORDER — QUETIAPINE FUMARATE ER 300 MG PO TB24: 300.0000 mg | ORAL_TABLET | Freq: Every day | ORAL | 2 refills | Status: DC

## 2016-10-22 MED ORDER — QUETIAPINE FUMARATE ER 200 MG PO TB24: 200.0000 mg | ORAL_TABLET | Freq: Every day | ORAL | 0 refills | Status: DC

## 2016-10-22 MED ORDER — QUETIAPINE FUMARATE ER 200 MG PO TB24
200.0000 mg | ORAL_TABLET | Freq: Every day | ORAL | Status: DC
  Filled 2016-10-22: qty 7

## 2016-10-22 MED ORDER — QUETIAPINE FUMARATE ER 300 MG PO TB24
300.0000 mg | ORAL_TABLET | Freq: Every day | ORAL | Status: DC
  Filled 2016-10-22: qty 1

## 2016-10-22 NOTE — ED Notes (Signed)
Patient observed lying in bed with eyes closed  Even, unlabored respirations observed   NAD pt appears to be sleeping  I will continue to monitor along with every 15 minute visual observations and ongoing security monitoring    

## 2016-10-22 NOTE — Consult Note (Signed)
  To Whom It May Concern in bed in the care of [name]:  Susan [name] has been evaluated at our emergency room and is now ready for discharge and may be returned to jail. Having reviewed her history and symptoms I feel very strongly that she needs ongoing treatment with antipsychotic medication, specifically Seroquel extended release 300 mg at night. I am aware that the patient is pregnant and intends to carry her pregnancy to term. I believe there the benefit of the medication outweighs any risk to the patient or the fetus in this particular situation. A prescription has been printed and will be returned to jail. I have spoken with nursing at the jail this afternoon to confirm the plan.    Marshell Garfinkel M.D. Staff psychiatrist, [facility]

## 2016-10-22 NOTE — ED Notes (Signed)
IVC/  PENDING  PLACEMENT 

## 2016-10-22 NOTE — ED Notes (Signed)
BEHAVIORAL HEALTH ROUNDING Patient sleeping: No. Patient alert and oriented: yes Behavior appropriate: Yes.  ; If no, describe:  Nutrition and fluids offered: yes Toileting and hygiene offered: Yes  Sitter present: q15 minute observations and security  monitoring Law enforcement present: Yes  ODS  

## 2016-10-22 NOTE — ED Notes (Signed)
2 Officers still in room with patient. Patient does not have cuffs on at this time.

## 2016-10-22 NOTE — ED Provider Notes (Signed)
-----------------------------------------   4:53 PM on 10/22/2016 -----------------------------------------  Pt medically cleared pending psych dispo.   Psychiatry feels she is safe for d/c to jail. Dr. Weber Cooks has arranged to have meds for her in prison, which he is writing for. He has d/w jail nurse.   No si or hi at d.c.   Schuyler Amor, MD 10/22/16 1655

## 2016-10-22 NOTE — ED Notes (Signed)
Pt given breakfast tray

## 2016-10-22 NOTE — BH Assessment (Signed)
Benny requests pt ultrasound. Pt RN (Amy) informed. Clinician unable to reach EDP to inform of requests (4 attempts).

## 2016-10-22 NOTE — ED Notes (Signed)
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
## 2016-10-22 NOTE — ED Notes (Signed)
ED Is the patient under IVC or is there intent for IVC: Yes.   Is the patient medically cleared: Yes.   Is there vacancy in the ED BHU: Yes.   Is the population mix appropriate for patient: Yes.   Is the patient awaiting placement in inpatient or outpatient setting: Yes.   Has the patient had a psychiatric consult: Yes.   Survey of unit performed for contraband, proper placement and condition of furniture, tampering with fixtures in bathroom, shower, and each patient room: Yes.  ; Findings:  APPEARANCE/BEHAVIOR Calm and cooperative NEURO ASSESSMENT Orientation: oriented x4  Denies pain Hallucinations: No.None noted (Hallucinations) Speech: Normal Gait: normal RESPIRATORY ASSESSMENT Even  Unlabored respirations  CARDIOVASCULAR ASSESSMENT Pulses equal   regular rate  Skin warm and dry   GASTROINTESTINAL ASSESSMENT no GI complaint EXTREMITIES Full ROM  PLAN OF CARE Provide calm/safe environment. Vital signs assessed twice daily. ED BHU Assessment once each 12-hour shift. Collaborate with tts when available or as condition indicates. Assure the ED provider has rounded once each shift. Provide and encourage hygiene. Provide redirection as needed. Assess for escalating behavior; address immediately and inform ED provider.  Assess family dynamic and appropriateness for visitation as needed: Yes.  ; If necessary, describe findings:  Educate the patient/family about BHU procedures/visitation: Yes.  ; If necessary, describe findings:

## 2017-09-03 ENCOUNTER — Encounter (HOSPITAL_COMMUNITY): Payer: Self-pay

## 2018-02-10 NOTE — L&D Delivery Note (Signed)
Date of delivery: 02/08/2019 Estimated Date of Delivery: 02/14/19 No LMP recorded. Patient is pregnant. EGA: [redacted]w[redacted]d  Delivery Note At 6:51 AM a viable female was delivered via Vaginal, Spontaneous (Presentation: OA, Left Occiput Anterior).   APGAR: 9, 9; weight: 3410 g, 7 pounds 8 ounces.   Placenta status: Spontaneous, Intact.  Cord: 3 vessels with the following complications: None.  Cord pH: NA  Called to see patient.  Mom pushed to deliver a viable female infant.  The head followed by shoulders, which delivered without difficulty, and the rest of the body.  Nuchal cord noted and reduced on the perineum. Body cord reduced after delivery of the body.  Baby to mom's chest.  Cord clamped and cut after 3 min delay.  Cord blood obtained.  Placenta delivered spontaneously, intact, with a 3-vessel cord.   All counts correct.  Hemostasis obtained with IV pitocin and fundal massage.   Anesthesia: None Episiotomy: None Lacerations: Vaginal abrasions, hemostatic Suture Repair: NA Est. Blood Loss (mL): 165  Mom to postpartum.  Baby to Couplet care / Skin to Skin.  Rod Can, CNM 02/08/2019, 7:49 AM

## 2018-02-25 ENCOUNTER — Ambulatory Visit: Payer: Medicare Other | Admitting: Obstetrics & Gynecology

## 2018-03-11 NOTE — Telephone Encounter (Signed)
na

## 2018-06-27 ENCOUNTER — Emergency Department
Admission: EM | Admit: 2018-06-27 | Discharge: 2018-06-27 | Disposition: A | Discharge status: Home / Self Care | Payer: Medicare Other | Attending: Emergency Medicine | Admitting: Emergency Medicine

## 2018-06-27 ENCOUNTER — Emergency Department: Payer: Medicare Other

## 2018-06-27 ENCOUNTER — Other Ambulatory Visit: Payer: Self-pay

## 2018-06-27 DIAGNOSIS — O208 Other hemorrhage in early pregnancy: Secondary | ICD-10-CM | POA: Insufficient documentation

## 2018-06-27 DIAGNOSIS — F17210 Nicotine dependence, cigarettes, uncomplicated: Secondary | ICD-10-CM | POA: Insufficient documentation

## 2018-06-27 DIAGNOSIS — Z3A01 Less than 8 weeks gestation of pregnancy: Secondary | ICD-10-CM | POA: Insufficient documentation

## 2018-06-27 DIAGNOSIS — N3001 Acute cystitis with hematuria: Secondary | ICD-10-CM

## 2018-06-27 DIAGNOSIS — O9989 Other specified diseases and conditions complicating pregnancy, childbirth and the puerperium: Secondary | ICD-10-CM | POA: Diagnosis present

## 2018-06-27 DIAGNOSIS — A549 Gonococcal infection, unspecified: Secondary | ICD-10-CM

## 2018-06-27 DIAGNOSIS — O418X10 Other specified disorders of amniotic fluid and membranes, first trimester, not applicable or unspecified: Secondary | ICD-10-CM

## 2018-06-27 DIAGNOSIS — Z79899 Other long term (current) drug therapy: Secondary | ICD-10-CM | POA: Diagnosis not present

## 2018-06-27 DIAGNOSIS — O98211 Gonorrhea complicating pregnancy, first trimester: Secondary | ICD-10-CM | POA: Insufficient documentation

## 2018-06-27 LAB — CBC
HCT: 36 % (ref 36.0–46.0)
Hemoglobin: 11.5 g/dL — ABNORMAL LOW (ref 12.0–15.0)
MCH: 27.9 pg (ref 26.0–34.0)
MCHC: 31.9 g/dL (ref 30.0–36.0)
MCV: 87.4 fL (ref 80.0–100.0)
Platelets: 218 10*3/uL (ref 150–400)
RBC: 4.12 MIL/uL (ref 3.87–5.11)
RDW: 13.6 % (ref 11.5–15.5)
WBC: 8.9 10*3/uL (ref 4.0–10.5)
nRBC: 0 % (ref 0.0–0.2)

## 2018-06-27 LAB — WET PREP, GENITAL
Sperm: NONE SEEN
Trich, Wet Prep: NONE SEEN
Yeast Wet Prep HPF POC: NONE SEEN

## 2018-06-27 LAB — BASIC METABOLIC PANEL
Anion gap: 8 (ref 5–15)
BUN: 11 mg/dL (ref 6–20)
CO2: 22 mmol/L (ref 22–32)
Calcium: 8.8 mg/dL — ABNORMAL LOW (ref 8.9–10.3)
Chloride: 107 mmol/L (ref 98–111)
Creatinine, Ser: 0.56 mg/dL (ref 0.44–1.00)
GFR calc Af Amer: >60 mL/min (ref >60)
GFR calc non Af Amer: >60 mL/min (ref >60)
Glucose, Bld: 74 mg/dL (ref 70–99)
Potassium: 3.4 mmol/L — ABNORMAL LOW (ref 3.5–5.1)
Sodium: 137 mmol/L (ref 135–145)

## 2018-06-27 LAB — CHLAMYDIA/NGC RT PCR (ARMC ONLY)
Chlamydia Tr: NOT DETECTED
N gonorrhoeae: DETECTED — AB

## 2018-06-27 LAB — URINALYSIS, COMPLETE (UACMP) WITH MICROSCOPIC
Bacteria, UA: NONE SEEN
Bilirubin Urine: NEGATIVE
Glucose, UA: NEGATIVE mg/dL
Ketones, ur: NEGATIVE mg/dL
Nitrite: NEGATIVE
Protein, ur: 100 mg/dL — AB
RBC / HPF: >50 RBC/hpf — ABNORMAL HIGH (ref 0–5)
Specific Gravity, Urine: 1.029 (ref 1.005–1.030)
Squamous Epithelial / LPF: NONE SEEN (ref 0–5)
WBC, UA: >50 WBC/hpf — ABNORMAL HIGH (ref 0–5)
pH: 6 (ref 5.0–8.0)

## 2018-06-27 LAB — HCG, QUANTITATIVE, PREGNANCY: hCG, Beta Chain, Quant, S: 64108 m[IU]/mL — ABNORMAL HIGH (ref <5)

## 2018-06-27 MED ORDER — CEPHALEXIN 500 MG PO CAPS: 500.0000 mg | ORAL_CAPSULE | Freq: Three times a day (TID) | ORAL | 0 refills | Status: AC

## 2018-06-27 MED ORDER — CEPHALEXIN 500 MG PO CAPS
500.0000 mg | ORAL_CAPSULE | Freq: Once | ORAL | Status: AC
  Administered 2018-06-27: 18:00:00 500 mg via ORAL
  Filled 2018-06-27: qty 1

## 2018-06-27 MED ORDER — DEXTROSE 5 % IV SOLN
250.0000 mg | Freq: Once | INTRAVENOUS | Status: DC
  Filled 2018-06-27: qty 250

## 2018-06-27 MED ORDER — AZITHROMYCIN 500 MG PO TABS
1000.0000 mg | ORAL_TABLET | Freq: Once | ORAL | Status: AC
  Administered 2018-06-27: 1000 mg via ORAL
  Filled 2018-06-27: qty 2

## 2018-06-27 MED ORDER — CEFTRIAXONE SODIUM 250 MG IJ SOLR
250.0000 mg | Freq: Once | INTRAMUSCULAR | Status: AC
  Administered 2018-06-27: 250 mg via INTRAMUSCULAR
  Filled 2018-06-27: qty 250

## 2018-06-27 NOTE — ED Provider Notes (Signed)
[facility] Emergency Department Provider Note  ____________________________________________  Time seen: Approximately 5:32 PM  I have reviewed the triage vital signs and the nursing notes.   HISTORY  Chief Complaint Dysuria   HPI Susan [name] is a 33 y.o. female presents for evaluation of dysuria and hematuria.  Symptoms started 5 days ago and have been constant.  Patient reports a positive pregnancy test last week.  Has not established care.  This is her sixth pregnancy.  Patient does not know how far along she is since her menstrual periods are very regular.  She does not remember her last LMP.  She denies abdominal pain, nausea, vomiting, fever, chills, vaginal bleeding or vaginal discharge.  She saw her doctor via telemedicine for 5 days ago and was given Bactrim.  She took 2 days twice daily with no improvement of her symptoms.  She is complaining of mild dull bilateral flank pain which has also been present for the last 5 days.  Past Medical History:  Diagnosis Date  . Depression   . Schizoaffective disorder [facility])     Patient Active Problem List   Diagnosis Date Noted  . Schizoaffective disorder, depressive type ([place]) 10/21/2016  . Suicide attempt ([place]) 10/21/2016  . Encounter for supervision of low-risk pregnancy, antepartum 09/04/2016  . Pregnant 08/30/2015    Past Surgical History:  Procedure Laterality Date  . NO PAST SURGERIES      Prior to Admission medications   Medication Sig Start Date End Date Taking? Authorizing Provider  cephALEXin (KEFLEX) 500 MG capsule Take 1 capsule (500 mg total) by mouth 3 (three) times daily for 7 days. 06/27/18 07/04/18  Rudene Re, MD  lamoTRIgine (LAMICTAL) 100 MG tablet Take 100 mg by mouth daily.    [provider]  lithium carbonate 300 MG capsule Take 300 mg by mouth 2 (two) times daily. 09/09/16   [provider]  ondansetron (ZOFRAN ODT) 8 MG disintegrating tablet Take 1  tablet (8 mg total) by mouth every 8 (eight) hours as needed for nausea or vomiting. Patient not taking: Reported on 10/21/2016 09/16/16   Gae Dry, MD  prochlorperazine (COMPAZINE) 10 MG tablet Take 1 tablet (10 mg total) by mouth every 6 (six) hours as needed for nausea or vomiting. Patient not taking: Reported on 10/21/2016 08/31/15   Ward, Honor Loh, MD  promethazine (PHENERGAN) 12.5 MG suppository Place 1 suppository (12.5 mg total) rectally every 6 (six) hours as needed for nausea or vomiting. Patient not taking: Reported on 10/21/2016 09/16/16   Gae Dry, MD  pyridOXINE (VITAMIN B-6) 25 MG tablet Take 1 tablet (25 mg total) by mouth 3 (three) times daily. Patient not taking: Reported on 10/21/2016 08/31/15   Ward, Honor Loh, MD  QUEtiapine (SEROQUEL XR) 200 MG 24 hr tablet Take 1 tablet (200 mg total) by mouth at bedtime. 10/22/16   Clapacs, Madie Reno, MD  QUEtiapine (SEROQUEL XR) 300 MG 24 hr tablet Take 1 tablet (300 mg total) by mouth at bedtime. 10/22/16   Clapacs, Madie Reno, MD  thiamine 100 MG tablet Take 1 tablet (100 mg total) by mouth daily. 08/31/15   Ward, Honor Loh, MD    Allergies Patient has no known allergies.  Family History  Problem Relation Age of Onset  . Diabetes type II Mother   . Depression Mother   . Hypertension Paternal Grandmother   . Diabetes type II Paternal Grandmother     Social History Social History  Tobacco Use  . Smoking status: Current Every Day Smoker    Types: Cigarettes  . Smokeless tobacco: Never Used  Substance Use Topics  . Alcohol use: No  . Drug use: No    Review of Systems  Constitutional: Negative for fever. Eyes: Negative for visual changes. ENT: Negative for sore throat. Neck: No neck pain  Cardiovascular: Negative for chest pain. Respiratory: Negative for shortness of breath. Gastrointestinal: Negative for abdominal pain, vomiting or diarrhea. Genitourinary: + dysuria, hematuria, bilateral flank pain Musculoskeletal:  Negative for back pain. Skin: Negative for rash. Neurological: Negative for headaches, weakness or numbness. Psych: No SI or HI  ____________________________________________   PHYSICAL EXAM:  VITAL SIGNS: ED Triage Vitals  Enc Vitals Group     BP 06/27/18 1432 120/73     Pulse Rate 06/27/18 1432 91     Resp 06/27/18 1432 17     Temp 06/27/18 1432 98.7 F (37.1 C)     Temp Source 06/27/18 1432 Oral     SpO2 06/27/18 1432 100 %     Weight 06/27/18 1433 240 lb (108.9 kg)     Height 06/27/18 1433 5\' 8"  (1.727 m)     Head Circumference --      Peak Flow --      Pain Score 06/27/18 1433 6     Pain Loc --      Pain Edu? --      Excl. in [place]? --     Constitutional: Alert and oriented. Well appearing and in no apparent distress. HEENT:      Head: Normocephalic and atraumatic.         Eyes: Conjunctivae are normal. Sclera is non-icteric.       Mouth/Throat: Mucous membranes are moist.       Neck: Supple with no signs of meningismus. Cardiovascular: Regular rate and rhythm. No murmurs, gallops, or rubs. 2+ symmetrical distal pulses are present in all extremities. No JVD. Respiratory: Normal respiratory effort. Lungs are clear to auscultation bilaterally. No wheezes, crackles, or rhonchi.  Gastrointestinal: Soft, non tender, and non distended with positive bowel sounds. No rebound or guarding. Genitourinary: No CVA tenderness. Pelvic exam: Normal external genitalia, no rashes or lesions. Small amount of pink tinged white discharge. Normal cervical mucus. Os closed. No cervical motion tenderness.  No uterine or adnexal tenderness.   Musculoskeletal: Nontender with normal range of motion in all extremities. No edema, cyanosis, or erythema of extremities. Neurologic: Normal speech and language. Face is symmetric. Moving all extremities. No gross focal neurologic deficits are appreciated. Skin: Skin is warm, dry and intact. No rash noted. Psychiatric: Mood and affect are normal. Speech and  behavior are normal.  ____________________________________________   LABS (all labs ordered are listed, but only abnormal results are displayed)  Labs Reviewed  WET PREP, GENITAL - Abnormal; Notable for the following components:      Result Value   Clue Cells Wet Prep HPF POC PRESENT (*)    WBC, Wet Prep HPF POC FEW (*)    All other components within normal limits  CHLAMYDIA/NGC RT PCR ([facility] ONLY) - Abnormal; Notable for the following components:   N gonorrhoeae DETECTED (*)    All other components within normal limits  BASIC METABOLIC PANEL - Abnormal; Notable for the following components:   Potassium 3.4 (*)    Calcium 8.8 (*)    All other components within normal limits  CBC - Abnormal; Notable for the following components:   Hemoglobin 11.5 (*)  All other components within normal limits  HCG, QUANTITATIVE, PREGNANCY - Abnormal; Notable for the following components:   hCG, Beta Chain, Quant, S 64,108 (*)    All other components within normal limits  URINALYSIS, COMPLETE (UACMP) WITH MICROSCOPIC - Abnormal; Notable for the following components:   Color, Urine AMBER (*)    APPearance CLOUDY (*)    Hgb urine dipstick LARGE (*)    Protein, ur 100 (*)    Leukocytes,Ua MODERATE (*)    RBC / HPF >50 (*)    WBC, UA >50 (*)    All other components within normal limits  URINE CULTURE   ____________________________________________  EKG  none  ____________________________________________  RADIOLOGY  I have personally reviewed the images performed during this visit and I agree with the Radiologist's read.   Interpretation by Radiologist:  US Ob Comp Less 14 Wks  Result Date: 06/27/2018 CLINICAL DATA:  33 year old female is pregnant with abdominal pain, uncertain LMP. Quantitative beta HCG 64,108. Gross hematuria. EXAM: OBSTETRIC <14 WK [place] AND TRANSVAGINAL OB US TECHNIQUE: Both transabdominal and transvaginal ultrasound examinations were performed for complete  evaluation of the gestation as well as the maternal uterus, adnexal regions, and pelvic cul-de-sac. Transvaginal technique was performed to assess early pregnancy. COMPARISON:  Renal ultrasound today reported separately. FINDINGS: Intrauterine gestational sac: Single Yolk sac:  Visible Embryo:  Visible Cardiac Activity: Detected Heart Rate: 133 bpm CRL:  8.8 mm   6 w   6 d                  [place] EDC: 02/14/2019 Subchorionic hemorrhage: Small, encompassing 19 millimeters (image 53). Maternal uterus/adnexae: Small volume of simple appearing free fluid in the cul-de-sac (image 87). Normal right ovary, 2.9 x 1.9 x 1.8 centimeters. Probable left ovary corpus luteum cyst (image 119). Left ovary is 3.9 x 2.1 by 2.6 centimeters. IMPRESSION: Single living IUP demonstrated with small volume subchorionic hemorrhage and small volume simple appearing pelvic free fluid. Normal ovaries. Electronically Signed   By: Genevie Ann M.D.   On: 06/27/2018 20:42   US Ob Transvaginal  Result Date: 06/27/2018 CLINICAL DATA:  33 year old female is pregnant with abdominal pain, uncertain LMP. Quantitative beta HCG 64,108. Gross hematuria. EXAM: OBSTETRIC <14 WK [place] AND TRANSVAGINAL OB US TECHNIQUE: Both transabdominal and transvaginal ultrasound examinations were performed for complete evaluation of the gestation as well as the maternal uterus, adnexal regions, and pelvic cul-de-sac. Transvaginal technique was performed to assess early pregnancy. COMPARISON:  Renal ultrasound today reported separately. FINDINGS: Intrauterine gestational sac: Single Yolk sac:  Visible Embryo:  Visible Cardiac Activity: Detected Heart Rate: 133 bpm CRL:  8.8 mm   6 w   6 d                  [place] EDC: 02/14/2019 Subchorionic hemorrhage: Small, encompassing 19 millimeters (image 53). Maternal uterus/adnexae: Small volume of simple appearing free fluid in the cul-de-sac (image 87). Normal right ovary, 2.9 x 1.9 x 1.8 centimeters. Probable left ovary corpus luteum cyst  (image 119). Left ovary is 3.9 x 2.1 by 2.6 centimeters. IMPRESSION: Single living IUP demonstrated with small volume subchorionic hemorrhage and small volume simple appearing pelvic free fluid. Normal ovaries. Electronically Signed   By: Genevie Ann M.D.   On: 06/27/2018 20:42   US Renal  Result Date: 06/27/2018 CLINICAL DATA:  33 year old female is pregnant in the 1st trimester. Gross hematuria. EXAM: RENAL / URINARY TRACT ULTRASOUND COMPLETE COMPARISON:  CT Abdomen and Pelvis  10/30/2010 FINDINGS: Right Kidney: Renal measurements: 10.4 x 6.9 x 4.7 centimeters = volume: 178 mL . Echogenicity within normal limits. No mass or hydronephrosis visualized. Left Kidney: Renal measurements: 11.0 x 6.7 x 6.1 centimeters = volume: 236 mL. Echogenicity within normal limits. No mass or hydronephrosis visualized. Bladder: Appears normal for degree of bladder distention. No urinary debris identified. The right ureteral jet was detected with Doppler, the left was not identified during imaging. IMPRESSION: Normal ultrasound appearance of both kidneys and the urinary bladder. Electronically Signed   By: Genevie Ann M.D.   On: 06/27/2018 20:40      ____________________________________________   PROCEDURES  Procedure(s) performed: None Procedures Critical Care performed:  None ____________________________________________   INITIAL IMPRESSION / ASSESSMENT AND PLAN / ED COURSE  33 y.o. female presents for evaluation of dysuria and hematuria and bilateral flank pain x5 days.  Patient is well-appearing in no distress, she has normal vital signs, abdomen is soft with no tenderness, no flank tenderness.  Differential diagnoses including UTI versus pyelonephritis versus hemorrhagic cystitis.  Less likely kidney stone and bilateral flank pain however will send patient for renal ultrasound to ensure no evidence of obstruction.  Patient denies vaginal bleeding however will perform a pelvic exam and screen patient for STD.  We will  send patient for transvaginal ultrasound to determine how far along her pregnancy is.  Labs showing normal white count, hCG of 64,000.  UA showing moderate leukocytes with greater than 50 RBCs and greater than 50 WBCs.  No signs of sepsis.  Will send urine for culture.  Will start patient on Keflex    _________________________ 8:53 PM on 06/27/2018 -----------------------------------------  Vaginal swabs positive for gonorrhea.  Patient was treated with Rocephin and azithromycin.  No CMT and no concerns for PID at this time.  Transvaginal ultrasound confirms a 6-week and 6-day intrauterine pregnancy with small amount of subchorionic hemorrhage.  Patient's blood type is O+ with no indication for RhoGam.  Ultrasound of the kidneys showing no hydronephrosis.  Patient be discharged home with Keflex and follow-up with OB/GYN for prenatal care.  Discussed standard return precautions and close follow-up.   As part of my medical decision making, I reviewed the following data within the [place] notes reviewed and incorporated, Labs reviewed , Old chart reviewed, Radiograph reviewed , Notes from prior ED visits and [place] Controlled Substance Database    Pertinent labs & imaging results that were available during my care of the patient were reviewed by me and considered in my medical decision making (see chart for details).    ____________________________________________   FINAL CLINICAL IMPRESSION(S) / ED DIAGNOSES  Final diagnoses:  Acute cystitis with hematuria  N. gonorrhoeae  Subchorionic hemorrhage of placenta in first trimester, single or unspecified fetus      NEW MEDICATIONS STARTED DURING THIS VISIT:  ED Discharge Orders         Ordered    cephALEXin (KEFLEX) 500 MG capsule  3 times daily     06/27/18 2051           Note:  This document was prepared using Dragon voice recognition software and may include unintentional dictation errors.    Rudene Re, MD 06/27/18 [id number]

## 2018-06-27 NOTE — ED Notes (Signed)
Pt to US at this time.

## 2018-06-27 NOTE — ED Triage Notes (Signed)
Pt c/o painful urination with passing blood with odor since last Tuesday, states she did a virtual visit and was prescribed a 3 day course of abx. States she is now passing clots and the sx have not improved. States she is pregnant with positive home pregnancy test, unknown LMP, no prenatal care.

## 2018-06-28 LAB — URINE CULTURE: Culture: <10000 — AB

## 2018-07-23 ENCOUNTER — Encounter: Payer: Medicare Other | Admitting: Maternal Newborn

## 2018-08-09 ENCOUNTER — Other Ambulatory Visit (HOSPITAL_COMMUNITY)
Admission: RE | Admit: 2018-08-09 | Discharge: 2018-08-09 | Disposition: A | Discharge status: Home / Self Care | Payer: Medicare Other | Source: Ambulatory Visit | Attending: Obstetrics & Gynecology | Admitting: Obstetrics & Gynecology

## 2018-08-09 ENCOUNTER — Ambulatory Visit (INDEPENDENT_AMBULATORY_CARE_PROVIDER_SITE_OTHER): Payer: Medicare Other | Admitting: Obstetrics & Gynecology

## 2018-08-09 ENCOUNTER — Encounter: Payer: Self-pay | Admitting: Obstetrics & Gynecology

## 2018-08-09 ENCOUNTER — Other Ambulatory Visit: Payer: Self-pay

## 2018-08-09 VITALS — BP 112/62 | Wt 245.0 lb

## 2018-08-09 DIAGNOSIS — O99213 Obesity complicating pregnancy, third trimester: Secondary | ICD-10-CM | POA: Insufficient documentation

## 2018-08-09 DIAGNOSIS — R3915 Urgency of urination: Secondary | ICD-10-CM

## 2018-08-09 DIAGNOSIS — Z3A13 13 weeks gestation of pregnancy: Secondary | ICD-10-CM

## 2018-08-09 DIAGNOSIS — Z124 Encounter for screening for malignant neoplasm of cervix: Secondary | ICD-10-CM

## 2018-08-09 DIAGNOSIS — O418X10 Other specified disorders of amniotic fluid and membranes, first trimester, not applicable or unspecified: Secondary | ICD-10-CM

## 2018-08-09 DIAGNOSIS — O26891 Other specified pregnancy related conditions, first trimester: Secondary | ICD-10-CM

## 2018-08-09 DIAGNOSIS — O99212 Obesity complicating pregnancy, second trimester: Secondary | ICD-10-CM

## 2018-08-09 DIAGNOSIS — O468X1 Other antepartum hemorrhage, first trimester: Secondary | ICD-10-CM

## 2018-08-09 DIAGNOSIS — Z1379 Encounter for other screening for genetic and chromosomal anomalies: Secondary | ICD-10-CM

## 2018-08-09 DIAGNOSIS — Z3490 Encounter for supervision of normal pregnancy, unspecified, unspecified trimester: Secondary | ICD-10-CM

## 2018-08-09 NOTE — Progress Notes (Signed)
08/09/2018   Chief Complaint: Missed period  Transfer of Care Patient: no  History of Present Illness: Susan [name] is a 33 y.o. [id number] [redacted]w[redacted]d based on No LMP recorded and EDC based on early [place] at [facility]. Patient is pregnant. with an Estimated Date of Delivery: 02/14/19, with the above CC.   Her periods were: irreg every 4-6 weeks She was using no method when she conceived.  She has Positive signs or symptoms of nausea/vomiting of pregnancy. She has Negative signs or symptoms of miscarriage or preterm labor She identifies Negative Zika risk factors for her and her partner On any different medications around the time she conceived/early pregnancy: Yes    History of schizoaffective disorder, on Seroquel, Lamictal, and Lithium prior to pregnancy, now on Cymbalta, Gabapentin, Latuda, and Trazodone History of varicella: Yes   ROS: A 12-point review of systems was performed and negative, except as stated in the above HPI.  OBGYN History: As per HPI. OB History  Gravida Para Term Preterm AB Living  6 3 2 1 1 3   SAB TAB Ectopic Multiple Live Births  1       3    # Outcome Date GA Lbr Len/2nd Weight Sex Delivery Anes PTL Lv  6 Current           5 Term 10/30/13    M Vag-Spont   LIV  4 Term 10/31/10    F Vag-Spont   LIV  3 SAB 08/29/09          2 Preterm 06/29/08    F Vag-Spont   LIV     Complications: Oligohydramnios  1 Term 2019    F vag   LIV    Any issues with any prior pregnancies: yes, PTL/PTD at 36 weeks w first; all subsequent term deliveries Any prior children are healthy, doing well, without any problems or issues: yes History of pap smears: Yes. Last pap smear 2018 normal History of STIs: No   Past Medical History: Past Medical History:  Diagnosis Date  . Depression   . Schizoaffective disorder [facility])     Past Surgical History: Past Surgical History:  Procedure Laterality Date  . NO PAST SURGERIES      Family History:  Family History  Problem Relation Age of Onset   . Diabetes type II Mother   . Depression Mother   . Hypertension Paternal Grandmother   . Diabetes type II Paternal Grandmother    She denies any female cancers, bleeding or blood clotting disorders.  She denies any history of mental retardation, birth defects or genetic disorders in her or the FOB's history  Social History:  Social History   Socioeconomic History  . Marital status: Single    Spouse name: Not on file  . Number of children: Not on file  . Years of education: Not on file  . Highest education level: Not on file  Occupational History  . Not on file  Social Needs  . Financial resource strain: Not on file  . Food insecurity    Worry: Not on file    Inability: Not on file  . Transportation needs    Medical: Not on file    Non-medical: Not on file  Tobacco Use  . Smoking status: Current Every Day Smoker    Types: Cigarettes  . Smokeless tobacco: Never Used  Substance and Sexual Activity  . Alcohol use: No  . Drug use: No  . Sexual activity: Yes  Lifestyle  . Physical activity  Days per week: Not on file    Minutes per session: Not on file  . Stress: Not on file  Relationships  . Social Herbalist on phone: Not on file    Gets together: Not on file    Attends religious service: Not on file    Active member of club or organization: Not on file    Attends meetings of clubs or organizations: Not on file    Relationship status: Not on file  . Intimate partner violence    Fear of current or ex partner: Not on file    Emotionally abused: Not on file    Physically abused: Not on file    Forced sexual activity: Not on file  Other Topics Concern  . Not on file  Social History Narrative  . Not on file   Any pets in the household: no  Allergy: No Known Allergies  Current Outpatient Medications:  Current Outpatient Medications:  .  thiamine 100 MG tablet, Take 1 tablet (100 mg total) by mouth daily., Disp: 30 tablet, Rfl: 2   Physical  Exam:   BP 112/62   Wt 245 lb (111.1 kg)   BMI 37.25 kg/m  Body mass index is 37.25 kg/m. Constitutional: Well nourished, well developed female in no acute distress.  Neck:  Supple, normal appearance, and no thyromegaly  Cardiovascular: S1, S2 normal, no murmur, rub or gallop, regular rate and rhythm Respiratory:  Clear to auscultation bilateral. Normal respiratory effort Abdomen: positive bowel sounds and no masses, hernias; diffusely non tender to palpation, non distended Breasts: breasts appear normal, no suspicious masses, no skin or nipple changes or axillary nodes. Neuro/Psych:  Normal mood and affect.  Skin:  Warm and dry.  Lymphatic:  No inguinal lymphadenopathy.   Pelvic exam: is not limited by body habitus EGBUS: within normal limits, Vagina: within normal limits and with no blood in the vault, Cervix: normal appearing cervix without discharge or lesions, closed/long/high, Uterus:  enlarged: 12 weeks, and Adnexa:  normal adnexa  Assessment: Susan [name] is a 33 y.o. [id number] [redacted]w[redacted]d based on early [place] at [facility] with an Estimated Date of Delivery: 02/14/19,  for prenatal care.  Plan:  1) Avoid alcoholic beverages. 2) Patient encouraged not to smoke.  3) Discontinue the use of all non-medicinal drugs and chemicals.  4) Take prenatal vitamins daily.  5) Seatbelt use advised 6) Nutrition, food safety (fish, cheese advisories, and high nitrite foods) and exercise discussed. 7) Hospital and practice style delivering at [facility] discussed  8) Patient is asked about travel to areas at risk for the [place] virus, and counseled to avoid travel and exposure to mosquitoes or sexual partners who may have themselves been exposed to the virus. Testing is discussed, and will be ordered as appropriate.  9) Childbirth classes at [facility] advised 10) Genetic Screening, such as with 1st Trimester Screening, cell free fetal DNA, AFP testing, and Ultrasound, as well as with amniocentesis and CVS as appropriate, is  discussed with patient. She plans to have genetic testing this pregnancy. 11) [place] soon 12) Obesity risk factors discussed. Glucola nv. 13) Bring list of meds for next visit to confirm dosages, as not in system w her reported changes (Cymbalta, Latuda, Trazodone, Gabapentin)   Problem list reviewed and updated.  Barnett Applebaum, MD, [facility], [facility] 08/09/2018  4:11 PM

## 2018-08-09 NOTE — Patient Instructions (Signed)

## 2018-08-10 LAB — RPR+RH+ABO+RUB AB+AB SCR+CB...
Antibody Screen: NEGATIVE
HIV Screen 4th Generation wRfx: NONREACTIVE
Hematocrit: 37.1 % (ref 34.0–46.6)
Hemoglobin: 12.4 g/dL (ref 11.1–15.9)
Hepatitis B Surface Ag: NEGATIVE
MCH: 28.6 pg (ref 26.6–33.0)
MCHC: 33.4 g/dL (ref 31.5–35.7)
MCV: 86 fL (ref 79–97)
Platelets: 240 10*3/uL (ref 150–450)
RBC: 4.33 x10E6/uL (ref 3.77–5.28)
RDW: 13.1 % (ref 11.7–15.4)
RPR Ser Ql: NONREACTIVE
Rh Factor: POSITIVE
Rubella Antibodies, IGG: 4.06 index (ref >0.99)
Varicella zoster IgG: 857 index (ref >165)
WBC: 6.9 10*3/uL (ref 3.4–10.8)

## 2018-08-10 LAB — HEMOGLOBINOPATHY EVALUATION
HGB C: 0 %
HGB S: 0 %
HGB VARIANT: 0 %
Hemoglobin A2 Quantitation: 2.1 % (ref 1.8–3.2)
Hemoglobin F Quantitation: 0 % (ref 0.0–2.0)
Hgb A: 97.9 % (ref 96.4–98.8)

## 2018-08-11 LAB — URINE CULTURE

## 2018-08-12 LAB — CYTOLOGY - PAP
Chlamydia: NEGATIVE
Diagnosis: NEGATIVE
Neisseria Gonorrhea: NEGATIVE

## 2018-08-13 LAB — MATERNIT21 PLUS CORE+SCA
Fetal Fraction: 10
Monosomy X (Turner Syndrome): NOT DETECTED
Result (T21): NEGATIVE
Trisomy 13 (Patau syndrome): NEGATIVE
Trisomy 18 (Edwards syndrome): NEGATIVE
Trisomy 21 (Down syndrome): NEGATIVE
XXX (Triple X Syndrome): NOT DETECTED
XXY (Klinefelter Syndrome): NOT DETECTED
XYY (Jacobs Syndrome): NOT DETECTED

## 2018-08-17 ENCOUNTER — Encounter: Payer: Self-pay | Admitting: Maternal Newborn

## 2018-08-17 ENCOUNTER — Other Ambulatory Visit: Payer: Medicare Other

## 2018-08-17 ENCOUNTER — Ambulatory Visit (INDEPENDENT_AMBULATORY_CARE_PROVIDER_SITE_OTHER): Payer: Medicare Other

## 2018-08-17 ENCOUNTER — Ambulatory Visit (INDEPENDENT_AMBULATORY_CARE_PROVIDER_SITE_OTHER): Payer: Medicare Other | Admitting: Maternal Newborn

## 2018-08-17 ENCOUNTER — Other Ambulatory Visit: Payer: Self-pay

## 2018-08-17 VITALS — BP 110/58 | Wt 248.6 lb

## 2018-08-17 DIAGNOSIS — O418X20 Other specified disorders of amniotic fluid and membranes, second trimester, not applicable or unspecified: Secondary | ICD-10-CM

## 2018-08-17 DIAGNOSIS — Z3689 Encounter for other specified antenatal screening: Secondary | ICD-10-CM

## 2018-08-17 DIAGNOSIS — Z3490 Encounter for supervision of normal pregnancy, unspecified, unspecified trimester: Secondary | ICD-10-CM

## 2018-08-17 DIAGNOSIS — Z3492 Encounter for supervision of normal pregnancy, unspecified, second trimester: Secondary | ICD-10-CM

## 2018-08-17 DIAGNOSIS — O468X2 Other antepartum hemorrhage, second trimester: Secondary | ICD-10-CM | POA: Diagnosis not present

## 2018-08-17 DIAGNOSIS — Z3A14 14 weeks gestation of pregnancy: Secondary | ICD-10-CM

## 2018-08-17 DIAGNOSIS — O418X10 Other specified disorders of amniotic fluid and membranes, first trimester, not applicable or unspecified: Secondary | ICD-10-CM

## 2018-08-17 NOTE — Progress Notes (Signed)
ROB/Glucola/Ultrasound- no concerns

## 2018-08-17 NOTE — Progress Notes (Signed)
    Routine Prenatal Care Visit  Subjective  Susan [name] is a 33 y.o. [id number] at [redacted]w[redacted]d being seen today for ongoing prenatal care.  She is currently monitored for the following issues for this low-risk pregnancy and has Pregnant; Encounter for supervision of low-risk pregnancy, antepartum; Schizoaffective disorder, depressive type ([place]); Suicide attempt [facility]); and Obesity affecting pregnancy in second trimester on their problem list.  ----------------------------------------------------------------------------------- Patient reports occasional nausea, no problems with emesis or eating well. Vag. Bleeding: None.  Movement: Present. No leaking of fluid.  ----------------------------------------------------------------------------------- The following portions of the patient's history were reviewed and updated as appropriate: allergies, current medications, past family history, past medical history, past social history, past surgical history and problem list. Problem list updated.   Objective  Blood pressure (!) 110/58, weight 248 lb 9.6 oz (112.8 kg), unknown if currently breastfeeding. Pregravid weight 240 lb (108.9 kg) Total Weight Gain 8 lb 9.6 oz (3.901 kg)  Fetal Status: Fetal Heart Rate (bpm): Present   Movement: Present     General:  Alert, oriented and cooperative. Patient is in no acute distress.  Skin: Skin is warm and dry. No rash noted.   Cardiovascular: Normal heart rate noted  Respiratory: Normal respiratory effort, no problems with respiration noted  Abdomen: Soft, gravid, appropriate for gestational age. Pain/Pressure: Absent     Pelvic:  Cervical exam deferred        Extremities: Normal range of motion.     Mental Status: Normal mood and affect. Normal behavior. Normal judgment and thought content.     Assessment   33 y.o. [id number] at [redacted]w[redacted]d, EDD 02/14/2019 by Ultrasound presenting for a routine prenatal visit.  Plan   Pregnancy#6 Problems (from 05/10/18 to  present)    No problems associated with this episode.     Dating scan today shows singleton IUP at 15w, consistent with early ultrasound dating. Due date remains 02/14/2019.  FHR 150 bpm. No subchorionic bleed today.  Please refer to After Visit Summary for other counseling recommendations.   Return in about 4 weeks (around 09/14/2018) for ROB and anatomy scan.  Avel Sensor, CNM 08/17/2018  11:48 AM

## 2018-08-17 NOTE — Patient Instructions (Signed)

## 2018-08-18 ENCOUNTER — Other Ambulatory Visit: Payer: Self-pay | Admitting: Obstetrics & Gynecology

## 2018-08-18 LAB — GLUCOSE, 1 HOUR GESTATIONAL: Gestational Diabetes Screen: 79 mg/dL (ref 65–139)

## 2018-09-14 ENCOUNTER — Other Ambulatory Visit: Payer: Self-pay

## 2018-09-14 ENCOUNTER — Ambulatory Visit (INDEPENDENT_AMBULATORY_CARE_PROVIDER_SITE_OTHER): Payer: Medicare Other

## 2018-09-14 ENCOUNTER — Ambulatory Visit (INDEPENDENT_AMBULATORY_CARE_PROVIDER_SITE_OTHER): Payer: Medicare Other | Admitting: Obstetrics and Gynecology

## 2018-09-14 VITALS — BP 122/68 | Wt 245.0 lb

## 2018-09-14 DIAGNOSIS — Z0489 Encounter for examination and observation for other specified reasons: Secondary | ICD-10-CM

## 2018-09-14 DIAGNOSIS — Z3A18 18 weeks gestation of pregnancy: Secondary | ICD-10-CM

## 2018-09-14 DIAGNOSIS — Z3689 Encounter for other specified antenatal screening: Secondary | ICD-10-CM

## 2018-09-14 DIAGNOSIS — Z3490 Encounter for supervision of normal pregnancy, unspecified, unspecified trimester: Secondary | ICD-10-CM

## 2018-09-14 DIAGNOSIS — IMO0002 Reserved for concepts with insufficient information to code with codable children: Secondary | ICD-10-CM

## 2018-09-14 DIAGNOSIS — Z363 Encounter for antenatal screening for malformations: Secondary | ICD-10-CM | POA: Diagnosis not present

## 2018-09-14 DIAGNOSIS — O99212 Obesity complicating pregnancy, second trimester: Secondary | ICD-10-CM

## 2018-09-14 NOTE — Progress Notes (Signed)
ROB Anatomy scan/ It is a BOY!!

## 2018-09-14 NOTE — Progress Notes (Signed)
Routine Prenatal Care Visit  Subjective  Susan [name] is a 33 y.o. [id number] at [redacted]w[redacted]d being seen today for ongoing prenatal care.  She is currently monitored for the following issues for this low-risk pregnancy and has Pregnant; Encounter for supervision of low-risk pregnancy, antepartum; Schizoaffective disorder, depressive type ([place]); Suicide attempt [facility]); and Obesity affecting pregnancy in second trimester on their problem list.  ----------------------------------------------------------------------------------- Patient reports no complaints.   Contractions: Not present. Vag. Bleeding: None.  Movement: Present. Denies leaking of fluid.  ----------------------------------------------------------------------------------- The following portions of the patient's history were reviewed and updated as appropriate: allergies, current medications, past family history, past medical history, past social history, past surgical history and problem list. Problem list updated.   Objective  Blood pressure 122/68, weight 245 lb (111.1 kg), unknown if currently breastfeeding. Pregravid weight 240 lb (108.9 kg) Total Weight Gain 5 lb (2.268 kg) Urinalysis:      Fetal Status: Fetal Heart Rate (bpm): 140   Movement: Present     General:  Alert, oriented and cooperative. Patient is in no acute distress.  Skin: Skin is warm and dry. No rash noted.   Cardiovascular: Normal heart rate noted  Respiratory: Normal respiratory effort, no problems with respiration noted  Abdomen: Soft, gravid, appropriate for gestational age. Pain/Pressure: Absent     Pelvic:  Cervical exam deferred        Extremities: Normal range of motion.     ental Status: Normal mood and affect. Normal behavior. Normal judgment and thought content.   US Ob Comp + 14 Wk  Result Date: 09/14/2018 Patient Name: RENNE CORNICK DOB: Jun 09, 1985 MRN: [id number] ULTRASOUND REPORT Location: [facility] Date of Service: 09/14/2018  Indications:Anatomy Ultrasound Findings: Nelda [place]se[place] intrauterine pregnancy is visualized with FHR at 136 BPM. Biometrics give an (U/S) Gestational age of [redacted]w[redacted]d and an (U/S) EDD of 02/10/2019; this correlates with the clinically established Estimated Date of Delivery: 02/14/19 Fetal presentation is Breech. EFW: 260 g  ( 9 oz ). Placenta: anterior. Grade: 0 AFI: subjectively normal. Anatomic survey is incomplete for 3VV and normal; Gender - female.  Impression: 1. [redacted]w[redacted]d Viable Singleton Intrauterine pregnancy by U/S. 2. (U/S) EDD is consistent with Clinically established Estimated Date of Delivery: 02/14/19 . 3. Incomplete anatomy scan for 3VV. Recommendations: 1.Clinical correlation with the patient's History and Physical Exam. Gweneth Dimitri, RT  There is a singleton gestation with subjectively normal amniotic fluid volume. The fetal biometry correlates with established dating. Detailed evaluation of the fetal anatomy was performed.The fetal anatomical survey appears within normal limits within the resolution of ultrasound as described above.  Anatomy scan incomplete.  It must be noted that a normal ultrasound is unable to rule out fetal aneuploidy, subtle defects such as small ASD or VDS may also not be visible on imaging.  Malachy Mood, MD, [facility], [facility] 09/14/2018, 12:02 PM   US Ob Less Than 14 Weeks With Ob Transvaginal  Result Date: 08/17/2018 Patient Name: JERRIAH INES DOB: 02/09/1986 MRN: [id number] ULTRASOUND REPORT Location: [facility] Date of Service: 08/17/2018 Indications: Growth and follow up subchorionic bleed Findings: Nelda [place]se[place] intrauterine pregnancy is visualized with FHR at 150 BPM. Biometrics give an (U/S) Gestational age of [redacted]w[redacted]d and an (U/S) EDD of 02/08/2019; this correlates with the clinically established Estimated Date of Delivery: 02/14/19. Fetal presentation is Cephalic. Placenta: anterior. Grade: 0 Cervix is long and closed, 4.7 cm Normal amniotic  fluid EFW: 114 g ( 4 oz ) Impression: 1.  [redacted]w[redacted]d Viable Singleton Intrauterine pregnancy previously established criteria. 2. No subchorionic bleed is seen today. Recommendations: 1.Clinical correlation with the patient's History and Physical Exam. Gweneth Dimitri, RT I have reviewed this ultrasound and the report. I agree with the above assessment and plan. [facility] 08/17/18 5:40 PM      Assessment   33 y.o. [id number] at [redacted]w[redacted]d by  02/14/2019, by Ultrasound presenting for routine prenatal visit  Plan   Pregnancy#6 Problems (from 05/10/18 to present)    Problem Noted Resolved   Encounter for supervision of low-risk pregnancy, antepartum 09/04/2016 by Gae Dry, MD No   Overview Addendum 08/17/2018 11:52 AM by Rexene Agent, CNM    [facility] Prenatal Labs  Dating [place] at [facility] 6 weeks Blood type: O/Positive/-- (06/29 1611)   Genetic Screen NIPS: Negative XY Antibody:Negative (06/29 1611)  Anatomic [place]  Rubella: 4.06 (06/29 1611) Varicella: Immune  GTT Early:               Third trimester:  RPR: Non Reactive (06/29 1611)   Rhogam n/a HBsAg: Negative (06/29 1611)   TDaP vaccine                       Flu Shot: HIV: Non Reactive (06/29 1611)   Baby Food                                GBS:   Contraception BTL Pap:08/09/18  CBB  no   CS/VBAC n/a   Support Person                   Gestational age appropriate obstetric precautions including but not limited to vaginal bleeding, contractions, leaking of fluid and fetal movement were reviewed in detail with the patient.    - anatomy scan incomplete  Return in about 4 weeks (around 10/12/2018) for ROB and anatomy scan.  Malachy Mood, MD, [facility], [facility] 09/14/2018, 7:04 PM

## 2018-10-12 ENCOUNTER — Ambulatory Visit (INDEPENDENT_AMBULATORY_CARE_PROVIDER_SITE_OTHER): Payer: Medicare Other | Admitting: Certified Nurse Midwife

## 2018-10-12 ENCOUNTER — Ambulatory Visit (INDEPENDENT_AMBULATORY_CARE_PROVIDER_SITE_OTHER): Payer: Medicare Other

## 2018-10-12 ENCOUNTER — Encounter: Payer: Self-pay | Admitting: Certified Nurse Midwife

## 2018-10-12 ENCOUNTER — Other Ambulatory Visit: Payer: Self-pay

## 2018-10-12 VITALS — BP 100/60 | Wt 250.8 lb

## 2018-10-12 DIAGNOSIS — Z3490 Encounter for supervision of normal pregnancy, unspecified, unspecified trimester: Secondary | ICD-10-CM

## 2018-10-12 DIAGNOSIS — O9989 Other specified diseases and conditions complicating pregnancy, childbirth and the puerperium: Secondary | ICD-10-CM

## 2018-10-12 DIAGNOSIS — IMO0002 Reserved for concepts with insufficient information to code with codable children: Secondary | ICD-10-CM

## 2018-10-12 DIAGNOSIS — Z3A22 22 weeks gestation of pregnancy: Secondary | ICD-10-CM

## 2018-10-12 DIAGNOSIS — Z362 Encounter for other antenatal screening follow-up: Secondary | ICD-10-CM | POA: Diagnosis not present

## 2018-10-12 DIAGNOSIS — O0990 Supervision of high risk pregnancy, unspecified, unspecified trimester: Secondary | ICD-10-CM

## 2018-10-12 DIAGNOSIS — Z0489 Encounter for examination and observation for other specified reasons: Secondary | ICD-10-CM

## 2018-10-12 DIAGNOSIS — D352 Benign neoplasm of pituitary gland: Secondary | ICD-10-CM

## 2018-10-12 LAB — POCT URINALYSIS DIPSTICK OB
Glucose, UA: NEGATIVE
POC,PROTEIN,UA: NEGATIVE

## 2018-10-12 NOTE — Progress Notes (Signed)
No problems.rj 

## 2018-10-19 ENCOUNTER — Encounter: Payer: Self-pay | Admitting: Certified Nurse Midwife

## 2018-10-19 DIAGNOSIS — D352 Benign neoplasm of pituitary gland: Secondary | ICD-10-CM | POA: Insufficient documentation

## 2018-10-19 NOTE — Progress Notes (Signed)
HROB at 22wk 1 day: Good fetal movement. No concerns. Feeling baby move. Anatomy scan completed today. Reviewed records from previous deliveries and found that first baby was born at 82wk4d. History corrected to reflect new information ROB in 4 weeks  Dalia Heading, CNM

## 2018-11-09 ENCOUNTER — Ambulatory Visit (INDEPENDENT_AMBULATORY_CARE_PROVIDER_SITE_OTHER): Payer: Medicare Other | Admitting: Obstetrics and Gynecology

## 2018-11-09 ENCOUNTER — Other Ambulatory Visit: Payer: Self-pay

## 2018-11-09 VITALS — BP 102/58 | Wt 253.0 lb

## 2018-11-09 DIAGNOSIS — O0992 Supervision of high risk pregnancy, unspecified, second trimester: Secondary | ICD-10-CM

## 2018-11-09 DIAGNOSIS — Z3A26 26 weeks gestation of pregnancy: Secondary | ICD-10-CM

## 2018-11-09 DIAGNOSIS — O99212 Obesity complicating pregnancy, second trimester: Secondary | ICD-10-CM

## 2018-11-09 DIAGNOSIS — O26842 Uterine size-date discrepancy, second trimester: Secondary | ICD-10-CM

## 2018-11-09 DIAGNOSIS — O0990 Supervision of high risk pregnancy, unspecified, unspecified trimester: Secondary | ICD-10-CM

## 2018-11-09 NOTE — Progress Notes (Signed)
    Routine Prenatal Care Visit  Subjective  Susan [name] is a 33 y.o. [phone] at [redacted]w[redacted]d being seen today for ongoing prenatal care.  She is currently monitored for the following issues for this high-risk pregnancy and has Pregnant; Supervision of high risk pregnancy, antepartum; Schizoaffective disorder, depressive type ([place]); Suicide attempt ([place]); Obesity affecting pregnancy in second trimester; and Pituitary microadenoma ([place]) on their problem list.  ----------------------------------------------------------------------------------- Patient reports no complaints.   Contractions: Not present. Vag. Bleeding: None.  Movement: Present. Denies leaking of fluid.  ----------------------------------------------------------------------------------- The following portions of the patient's history were reviewed and updated as appropriate: allergies, current medications, past family history, past medical history, past social history, past surgical history and problem list. Problem list updated.   Objective  Blood pressure (!) 102/58, weight 253 lb (114.8 kg), unknown if currently breastfeeding. Pregravid weight 240 lb (108.9 kg) Total Weight Gain 13 lb (5.897 kg)  Body mass index is 38.47 kg/m.  Urinalysis:      Fetal Status: Fetal Heart Rate (bpm): 140 Fundal Height: 29 cm Movement: Present  Presentation: Vertex  General:  Alert, oriented and cooperative. Patient is in no acute distress.  Skin: Skin is warm and dry. No rash noted.   Cardiovascular: Normal heart rate noted  Respiratory: Normal respiratory effort, no problems with respiration noted  Abdomen: Soft, gravid, appropriate for gestational age. Pain/Pressure: Absent     Pelvic:  Cervical exam deferred        Extremities: Normal range of motion.     ental Status: Normal mood and affect. Normal behavior. Normal judgment and thought content.     Assessment   33 y.o. [id number] at [redacted]w[redacted]d by  02/14/2019, by Ultrasound presenting for  routine prenatal visit  Plan   Pregnancy#6 Problems (from 05/10/18 to present)    Problem Noted Resolved   Supervision of high risk pregnancy, antepartum 09/04/2016 by Gae Dry, MD No   Overview Addendum 10/19/2018 12:36 AM by Dalia Heading, CNM    [facility] Prenatal Labs  Dating [place] at [facility] 6 weeks Blood type: O/Positive/-- (06/29 1611)   Genetic Screen NIPS: Negative XY Antibody:Negative (06/29 1611)  Anatomic [place] Normal anatomy, anterior placenta Rubella: 4.06 (06/29 1611) Varicella: Immune  GTT Early:  79             Third trimester:  RPR: Non Reactive (06/29 1611)   Rhogam n/a HBsAg: Negative (06/29 1611)   TDaP vaccine                       Flu Shot: HIV: Non Reactive (06/29 1611)   Baby Food                                GBS:   Contraception BTL Pap:08/09/18  CBB  no   CS/VBAC n/a   Support Person                   Gestational age appropriate obstetric precautions including but not limited to vaginal bleeding, contractions, leaking of fluid and fetal movement were reviewed in detail with the patient.    Return in about 2 weeks (around 11/23/2018) for  ROB, 28 week labs, and growth scan.  Malachy Mood, MD, [facility], [facility] 11/09/2018, 12:00 PM

## 2018-11-09 NOTE — Progress Notes (Signed)
ROB

## 2018-11-25 ENCOUNTER — Ambulatory Visit (INDEPENDENT_AMBULATORY_CARE_PROVIDER_SITE_OTHER): Payer: Medicare Other | Admitting: Certified Nurse Midwife

## 2018-11-25 ENCOUNTER — Other Ambulatory Visit: Payer: Medicare Other

## 2018-11-25 ENCOUNTER — Encounter: Payer: Self-pay | Admitting: Certified Nurse Midwife

## 2018-11-25 ENCOUNTER — Ambulatory Visit (INDEPENDENT_AMBULATORY_CARE_PROVIDER_SITE_OTHER): Payer: Medicare Other

## 2018-11-25 ENCOUNTER — Other Ambulatory Visit: Payer: Self-pay

## 2018-11-25 VITALS — BP 100/60 | Wt 251.0 lb

## 2018-11-25 DIAGNOSIS — O0990 Supervision of high risk pregnancy, unspecified, unspecified trimester: Secondary | ICD-10-CM

## 2018-11-25 DIAGNOSIS — O99212 Obesity complicating pregnancy, second trimester: Secondary | ICD-10-CM

## 2018-11-25 DIAGNOSIS — Z3A28 28 weeks gestation of pregnancy: Secondary | ICD-10-CM

## 2018-11-25 DIAGNOSIS — O26842 Uterine size-date discrepancy, second trimester: Secondary | ICD-10-CM

## 2018-11-25 DIAGNOSIS — Z362 Encounter for other antenatal screening follow-up: Secondary | ICD-10-CM | POA: Diagnosis not present

## 2018-11-25 DIAGNOSIS — O0993 Supervision of high risk pregnancy, unspecified, third trimester: Secondary | ICD-10-CM

## 2018-11-25 NOTE — Progress Notes (Signed)
No problems; declines flu shot.rj

## 2018-11-25 NOTE — Progress Notes (Signed)
HROB at 28wk3d: Problem list includes schizoaffective disorder, hx of pituitary tumor (negative CT scan of head in 2016), obesity in pregnancy. Doing well. Baby active. Desires BTL. Will be breast feeding. Having 28 week labs today. O POS blood type. Reports that her last baby is 32 year old. Delivered at [facility]. OB HX updated. Growth scan today: CGA 29 weeks, EFW 2#15oz/ 1346 gm, 67%, normal Dopplers (unsure why Dopplers were done?), variable presentation TWG 11#, FH 31cm, FHT123 BP 100/60  A: IUP at 28wk3d with normal growth  P: Recommend patient bring her Medicaid card to next OB visit in 2 weeks to sign 30 day Medicaid papers Offer TDAP at next visit.  Declines flu shot Given RSB info  Dalia Heading, [place]

## 2018-11-26 LAB — 28 WEEK RH+PANEL
Basophils Absolute: 0 10*3/uL (ref 0.0–0.2)
Basos: 0 %
EOS (ABSOLUTE): 0.1 10*3/uL (ref 0.0–0.4)
Eos: 1 %
Gestational Diabetes Screen: 102 mg/dL (ref 65–139)
HIV Screen 4th Generation wRfx: NONREACTIVE
Hematocrit: 33.4 % — ABNORMAL LOW (ref 34.0–46.6)
Hemoglobin: 10.8 g/dL — ABNORMAL LOW (ref 11.1–15.9)
Immature Grans (Abs): 0 10*3/uL (ref 0.0–0.1)
Immature Granulocytes: 1 %
Lymphocytes Absolute: 1.3 10*3/uL (ref 0.7–3.1)
Lymphs: 18 %
MCH: 27.6 pg (ref 26.6–33.0)
MCHC: 32.3 g/dL (ref 31.5–35.7)
MCV: 85 fL (ref 79–97)
Monocytes Absolute: 0.5 10*3/uL (ref 0.1–0.9)
Monocytes: 7 %
Neutrophils Absolute: 5.2 10*3/uL (ref 1.4–7.0)
Neutrophils: 73 %
Platelets: 179 10*3/uL (ref 150–450)
RBC: 3.91 x10E6/uL (ref 3.77–5.28)
RDW: 12.6 % (ref 11.7–15.4)
RPR Ser Ql: NONREACTIVE
WBC: 7.1 10*3/uL (ref 3.4–10.8)

## 2018-12-03 ENCOUNTER — Other Ambulatory Visit: Payer: Self-pay | Admitting: Obstetrics and Gynecology

## 2018-12-03 ENCOUNTER — Encounter: Payer: Self-pay | Admitting: Obstetrics and Gynecology

## 2018-12-03 DIAGNOSIS — O99019 Anemia complicating pregnancy, unspecified trimester: Secondary | ICD-10-CM | POA: Insufficient documentation

## 2018-12-03 MED ORDER — FERROUS SULFATE 325 (65 FE) MG PO TABS: 325.0000 mg | ORAL_TABLET | Freq: Every day | ORAL | 3 refills | Status: DC

## 2018-12-08 ENCOUNTER — Ambulatory Visit (INDEPENDENT_AMBULATORY_CARE_PROVIDER_SITE_OTHER): Payer: Medicare Other | Admitting: Obstetrics and Gynecology

## 2018-12-08 ENCOUNTER — Other Ambulatory Visit: Payer: Self-pay

## 2018-12-08 VITALS — BP 118/80 | Wt 250.0 lb

## 2018-12-08 DIAGNOSIS — Z23 Encounter for immunization: Secondary | ICD-10-CM | POA: Diagnosis not present

## 2018-12-08 DIAGNOSIS — O99213 Obesity complicating pregnancy, third trimester: Secondary | ICD-10-CM

## 2018-12-08 DIAGNOSIS — O0990 Supervision of high risk pregnancy, unspecified, unspecified trimester: Secondary | ICD-10-CM

## 2018-12-08 DIAGNOSIS — O99212 Obesity complicating pregnancy, second trimester: Secondary | ICD-10-CM

## 2018-12-08 DIAGNOSIS — F251 Schizoaffective disorder, depressive type: Secondary | ICD-10-CM

## 2018-12-08 DIAGNOSIS — Z3A30 30 weeks gestation of pregnancy: Secondary | ICD-10-CM

## 2018-12-08 NOTE — Progress Notes (Signed)
Routine Prenatal Care Visit  Subjective  Susan Susan [name] is a 33 y.o. [id number] at [redacted]w[redacted]d being seen today for ongoing prenatal care.  She is currently monitored for the following issues for this high-risk pregnancy and has Pregnant; Supervision of high risk pregnancy, antepartum; Schizoaffective disorder, depressive type ([place]); Suicide attempt ([place]); Obesity affecting pregnancy in second trimester; Pituitary microadenoma ([place]); and Anemia affecting pregnancy on their problem list.  ----------------------------------------------------------------------------------- Susan [name] reports dizzyness.   Contractions: Not present. Vag. Bleeding: None.  Movement: Present. Denies leaking of fluid.  ----------------------------------------------------------------------------------- The following portions of the Susan [name]'s history were reviewed and updated as appropriate: allergies, current medications, past family history, past medical history, past social history, past surgical history and problem list. Problem list updated.   Objective  Blood pressure 118/80, weight 250 lb (113.4 kg), unknown if currently breastfeeding. Pregravid weight 240 lb (108.9 kg) Total Weight Gain 10 lb (4.536 kg)  Body mass index is 38.01 kg/m.  Urinalysis:      Fetal Status: Fetal Heart Rate (bpm): 125 Fundal Height: 32 cm Movement: Present  Presentation: Vertex  General:  Alert, oriented and cooperative. Susan [name] is in no acute distress.  Skin: Skin is warm and dry. No rash noted.   Cardiovascular: Normal heart rate noted  Respiratory: Normal respiratory effort, no problems with respiration noted  Abdomen: Soft, gravid, appropriate for gestational age. Pain/Pressure: Absent     Pelvic:  Cervical exam deferred        Extremities: Normal range of motion.     ental Status: Normal mood and affect. Normal behavior. Normal judgment and thought content.     Assessment   34 y.o. [id number] at [redacted]w[redacted]d by  02/14/2019, by Ultrasound  presenting for routine prenatal visit  Plan   Pregnancy#6 Problems (from 05/10/18 to present)    Problem Noted Resolved   Anemia affecting pregnancy 12/03/2018 by Malachy Mood, MD No   Supervision of high risk pregnancy, antepartum 09/04/2016 by Gae Dry, MD No   Overview Addendum 12/03/2018 10:45 AM by Malachy Mood, MD    [facility]atal [facility]  Dating [place] at [facility] 6 weeks Blood type: O/Positive/-- (06/29 1611)   Genetic Screen NIPS: Negative XY Antibody:Negative (06/29 1611)  Anatomic [place] Normal anatomy, anterior placenta Rubella: 4.06 (06/29 1611) Varicella: Immune  GTT Early:  79             Third trimester: 102 RPR: Non Reactive (06/29 1611)   Rhogam n/a HBsAg: Negative (06/29 1611)   TDaP vaccine                       Flu Shot: HIV: Non Reactive (06/29 1611)   Baby Food    Breast                            GBS:   Contraception BTL ( ) 30 day papers Pap:08/09/18  CBB  no   CS/VBAC n/a   Support Person                   Gestational age appropriate obstetric precautions including but not limited to vaginal bleeding, contractions, leaking of fluid and fetal movement were reviewed in detail with the Susan [name].    Discussed iron supplementation in setting of anemia.  Also discussed caval compression during pregnancy and means of minimizing resulting vasovagal symptoms such as avoiding getting up to quickly, becoming overheated, and support hose  BTL  consent today  Return in about 2 weeks (around 12/22/2018) for ROB.  Malachy Mood, MD, [facility], [facility] 12/08/2018, 9:40 AM

## 2018-12-08 NOTE — Progress Notes (Signed)
ROB BT/Tdap today C/o pelvic pressure, fatigue, and having dizzy spells Denies lof, no vb, Good FM, No HAs

## 2018-12-08 NOTE — Addendum Note (Signed)
Addended by: Raechel Chute on: 12/08/2018 10:12 AM   Modules accepted: Orders

## 2018-12-21 ENCOUNTER — Other Ambulatory Visit: Payer: Self-pay

## 2018-12-21 ENCOUNTER — Ambulatory Visit (INDEPENDENT_AMBULATORY_CARE_PROVIDER_SITE_OTHER): Payer: Medicare Other | Admitting: Obstetrics and Gynecology

## 2018-12-21 VITALS — BP 108/70 | Wt 253.0 lb

## 2018-12-21 DIAGNOSIS — O99013 Anemia complicating pregnancy, third trimester: Secondary | ICD-10-CM

## 2018-12-21 DIAGNOSIS — O0993 Supervision of high risk pregnancy, unspecified, third trimester: Secondary | ICD-10-CM

## 2018-12-21 DIAGNOSIS — Z3A32 32 weeks gestation of pregnancy: Secondary | ICD-10-CM

## 2018-12-21 DIAGNOSIS — O99213 Obesity complicating pregnancy, third trimester: Secondary | ICD-10-CM

## 2018-12-21 DIAGNOSIS — F251 Schizoaffective disorder, depressive type: Secondary | ICD-10-CM

## 2018-12-21 DIAGNOSIS — O0990 Supervision of high risk pregnancy, unspecified, unspecified trimester: Secondary | ICD-10-CM

## 2018-12-21 LAB — POCT URINALYSIS DIPSTICK OB
Glucose, UA: NEGATIVE
POC,PROTEIN,UA: NEGATIVE

## 2018-12-21 NOTE — Progress Notes (Signed)
ROB

## 2018-12-21 NOTE — Addendum Note (Signed)
Addended by: [place], Gurshan Settlemire B on: 12/21/2018 11:42 AM   Modules accepted: Orders

## 2018-12-21 NOTE — Progress Notes (Signed)
    Routine Prenatal Care Visit  Subjective  Susan [name] is a 33 y.o. [id number] at [redacted]w[redacted]d being seen today for ongoing prenatal care.  She is currently monitored for the following issues for this high-risk pregnancy and has Pregnant; Supervision of high risk pregnancy, antepartum; Schizoaffective disorder, depressive type ([place]); Suicide attempt ([place]); Obesity affecting pregnancy in second trimester; Pituitary microadenoma ([place]); and Anemia affecting pregnancy on their problem list.  ----------------------------------------------------------------------------------- Patient reports no complaints.   Contractions: Not present. Vag. Bleeding: None.  Movement: Present. Denies leaking of fluid.  ----------------------------------------------------------------------------------- The following portions of the patient's history were reviewed and updated as appropriate: allergies, current medications, past family history, past medical history, past social history, past surgical history and problem list. Problem list updated.   Objective  Blood pressure 108/70, weight 253 lb (114.8 kg), unknown if currently breastfeeding. Pregravid weight 240 lb (108.9 kg) Total Weight Gain 13 lb (5.897 kg)  Body mass index is 38.47 kg/m.  Urinalysis:      Fetal Status: Fetal Heart Rate (bpm): 135 Fundal Height: 33 cm Movement: Present  Presentation: Complete Breech  General:  Alert, oriented and cooperative. Patient is in no acute distress.  Skin: Skin is warm and dry. No rash noted.   Cardiovascular: Normal heart rate noted  Respiratory: Normal respiratory effort, no problems with respiration noted  Abdomen: Soft, gravid, appropriate for gestational age. Pain/Pressure: Absent     Pelvic:  Cervical exam deferred        Extremities: Normal range of motion.     ental Status: Normal mood and affect. Normal behavior. Normal judgment and thought content.     Assessment   33 y.o. [id number] at [redacted]w[redacted]d by  02/14/2019,  by Ultrasound presenting for routine prenatal visit  Plan   Pregnancy#6 Problems (from 05/10/18 to present)    Problem Noted Resolved   Anemia affecting pregnancy 12/03/2018 by Malachy Mood, MD No   Supervision of high risk pregnancy, antepartum 09/04/2016 by Gae Dry, MD No   Overview Addendum 12/03/2018 10:45 AM by Malachy Mood, MD    [facility] Prenatal Labs  Dating [place] at [facility] 6 weeks Blood type: O/Positive/-- (06/29 1611)   Genetic Screen NIPS: Negative XY Antibody:Negative (06/29 1611)  Anatomic [place] Normal anatomy, anterior placenta Rubella: 4.06 (06/29 1611) Varicella: Immune  GTT Early:  79             Third trimester: 102 RPR: Non Reactive (06/29 1611)   Rhogam n/a HBsAg: Negative (06/29 1611)   TDaP vaccine                       Flu Shot: HIV: Non Reactive (06/29 1611)   Baby Food    Breast                            GBS:   Contraception BTL ( ) 30 day papers Pap:08/09/18  CBB  no   CS/VBAC n/a   Support Person                   Gestational age appropriate obstetric precautions including but not limited to vaginal bleeding, contractions, leaking of fluid and fetal movement were reviewed in detail with the patient.    Return in about 2 weeks (around 01/04/2019) for ROB.  Malachy Mood, MD, [facility], [facility] 12/21/2018, 11:12 AM

## 2019-01-04 ENCOUNTER — Other Ambulatory Visit: Payer: Self-pay

## 2019-01-04 ENCOUNTER — Encounter: Payer: Self-pay | Admitting: Advanced Practice Midwife

## 2019-01-04 ENCOUNTER — Ambulatory Visit (INDEPENDENT_AMBULATORY_CARE_PROVIDER_SITE_OTHER): Payer: Medicare Other | Admitting: Advanced Practice Midwife

## 2019-01-04 VITALS — BP 120/80 | Wt 252.0 lb

## 2019-01-04 DIAGNOSIS — Z3A34 34 weeks gestation of pregnancy: Secondary | ICD-10-CM

## 2019-01-04 DIAGNOSIS — O26843 Uterine size-date discrepancy, third trimester: Secondary | ICD-10-CM | POA: Insufficient documentation

## 2019-01-04 DIAGNOSIS — O0990 Supervision of high risk pregnancy, unspecified, unspecified trimester: Secondary | ICD-10-CM

## 2019-01-04 DIAGNOSIS — O368130 Decreased fetal movements, third trimester, not applicable or unspecified: Secondary | ICD-10-CM
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
## 2019-01-19 ENCOUNTER — Other Ambulatory Visit: Payer: Self-pay

## 2019-01-19 ENCOUNTER — Encounter: Payer: Self-pay | Admitting: Obstetrics and Gynecology

## 2019-01-19 ENCOUNTER — Ambulatory Visit (INDEPENDENT_AMBULATORY_CARE_PROVIDER_SITE_OTHER): Payer: Medicare Other

## 2019-01-19 ENCOUNTER — Other Ambulatory Visit (HOSPITAL_COMMUNITY)
Admission: RE | Admit: 2019-01-19 | Discharge: 2019-01-19 | Disposition: A | Discharge status: Home / Self Care | Payer: Medicare Other | Source: Ambulatory Visit | Attending: Obstetrics and Gynecology | Admitting: Obstetrics and Gynecology

## 2019-01-19 ENCOUNTER — Ambulatory Visit (INDEPENDENT_AMBULATORY_CARE_PROVIDER_SITE_OTHER): Payer: Medicare Other | Admitting: Obstetrics and Gynecology

## 2019-01-19 VITALS — BP 120/78 | Wt 252.0 lb

## 2019-01-19 DIAGNOSIS — Z3A36 36 weeks gestation of pregnancy: Secondary | ICD-10-CM | POA: Diagnosis present

## 2019-01-19 DIAGNOSIS — O26843 Uterine size-date discrepancy, third trimester: Secondary | ICD-10-CM

## 2019-01-19 DIAGNOSIS — O0993 Supervision of high risk pregnancy, unspecified, third trimester: Secondary | ICD-10-CM

## 2019-01-19 DIAGNOSIS — Z362 Encounter for other antenatal screening follow-up: Secondary | ICD-10-CM

## 2019-01-19 DIAGNOSIS — O0990 Supervision of high risk pregnancy, unspecified, unspecified trimester: Secondary | ICD-10-CM

## 2019-01-19 DIAGNOSIS — O99213 Obesity complicating pregnancy, third trimester: Secondary | ICD-10-CM

## 2019-01-19 DIAGNOSIS — O99013 Anemia complicating pregnancy, third trimester: Secondary | ICD-10-CM

## 2019-01-19 LAB — POCT URINALYSIS DIPSTICK OB: Glucose, UA: NEGATIVE

## 2019-01-19 NOTE — Addendum Note (Signed)
Addended by: Raechel Chute on: 01/19/2019 11:36 AM   Modules accepted: Orders
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
## 2019-01-19 NOTE — Patient Instructions (Signed)
Third Trimester of Pregnancy The third trimester is from week 28 through week 40 (months 7 through 9). The third trimester is a time when the unborn baby (fetus) is growing rapidly. At the end of the ninth month, the fetus is about 20 inches in length and weighs 6-10 pounds. Body changes during your third trimester Your body will continue to go through many changes during pregnancy. The changes vary from woman to woman. During the third trimester:  Your weight will continue to increase. You can expect to gain 25-35 pounds (11-16 kg) by the end of the pregnancy.  You may begin to get stretch marks on your hips, abdomen, and breasts.  You may urinate more often because the fetus is moving lower into your pelvis and pressing on your bladder.  You may develop or continue to have heartburn. This is caused by increased hormones that slow down muscles in the digestive tract.  You may develop or continue to have constipation because increased hormones slow digestion and cause the muscles that push waste through your intestines to relax.  You may develop hemorrhoids. These are swollen veins (varicose veins) in the rectum that can itch or be painful.  You may develop swollen, bulging veins (varicose veins) in your legs.  You may have increased body aches in the pelvis, back, or thighs. This is due to weight gain and increased hormones that are relaxing your joints.  You may have changes in your hair. These can include thickening of your hair, rapid growth, and changes in texture. Some women also have hair loss during or after pregnancy, or hair that feels dry or thin. Your hair will most likely return to normal after your baby is born.  Your breasts will continue to grow and they will continue to become tender. A yellow fluid (colostrum) may leak from your breasts. This is the first milk you are producing for your baby.  Your belly button may stick out.  You may notice more swelling in your hands,  face, or ankles.  You may have increased tingling or numbness in your hands, arms, and legs. The skin on your belly may also feel numb.  You may feel short of breath because of your expanding uterus.  You may have more problems sleeping. This can be caused by the size of your belly, increased need to urinate, and an increase in your body's metabolism.  You may notice the fetus "dropping," or moving lower in your abdomen (lightening).  You may have increased vaginal discharge.  You may notice your joints feel loose and you may have pain around your pelvic bone. What to expect at prenatal visits You will have prenatal exams every 2 weeks until week 36. Then you will have weekly prenatal exams. During a routine prenatal visit:  You will be weighed to make sure you and the baby are growing normally.  Your blood pressure will be taken.  Your abdomen will be measured to track your baby's growth.  The fetal heartbeat will be listened to.  Any test results from the previous visit will be discussed.  You may have a cervical check near your due date to see if your cervix has softened or thinned (effaced).  You will be tested for Group B streptococcus. This happens between 35 and 37 weeks. Your health care provider may ask you:  What your birth plan is.  How you are feeling.  If you are feeling the baby move.  If you have had any abnormal   symptoms, such as leaking fluid, bleeding, severe headaches, or abdominal cramping.  If you are using any tobacco products, including cigarettes, chewing tobacco, and electronic cigarettes.  If you have any questions. Other tests or screenings that may be performed during your third trimester include:  Blood tests that check for low iron levels (anemia).  Fetal testing to check the health, activity level, and growth of the fetus. Testing is done if you have certain medical conditions or if there are problems during the pregnancy.  Nonstress test  (NST). This test checks the health of your baby to make sure there are no signs of problems, such as the baby not getting enough oxygen. During this test, a belt is placed around your belly. The baby is made to move, and its heart rate is monitored during movement. What is false labor? False labor is a condition in which you feel small, irregular tightenings of the muscles in the womb (contractions) that usually go away with rest, changing position, or drinking water. These are called Braxton Hicks contractions. Contractions may last for hours, days, or even weeks before true labor sets in. If contractions come at regular intervals, become more frequent, increase in intensity, or become painful, you should see your health care provider. What are the signs of labor?  Abdominal cramps.  Regular contractions that start at 10 minutes apart and become stronger and more frequent with time.  Contractions that start on the top of the uterus and spread down to the lower abdomen and back.  Increased pelvic pressure and dull back pain.  A watery or bloody mucus discharge that comes from the vagina.  Leaking of amniotic fluid. This is also known as your "water breaking." It could be a slow trickle or a gush. Let your health care provider know if it has a color or strange odor. If you have any of these signs, call your health care provider right away, even if it is before your due date. Follow these instructions at home: Medicines  Follow your health care provider's instructions regarding medicine use. Specific medicines may be either safe or unsafe to take during pregnancy.  Take a prenatal vitamin that contains at least 600 micrograms (mcg) of folic acid.  If you develop constipation, try taking a stool softener if your health care provider approves. Eating and drinking   Eat a balanced diet that includes fresh fruits and vegetables, whole grains, good sources of protein such as meat, eggs, or tofu,  and low-fat dairy. Your health care provider will help you determine the amount of weight gain that is right for you.  Avoid raw meat and uncooked cheese. These carry germs that can cause birth defects in the baby.  If you have low calcium intake from food, talk to your health care provider about whether you should take a daily calcium supplement.  Eat four or five small meals rather than three large meals a day.  Limit foods that are high in fat and processed sugars, such as fried and sweet foods.  To prevent constipation: ? Drink enough fluid to keep your urine clear or pale yellow. ? Eat foods that are high in fiber, such as fresh fruits and vegetables, whole grains, and beans. Activity  Exercise only as directed by your health care provider. Most women can continue their usual exercise routine during pregnancy. Try to exercise for 30 minutes at least 5 days a week. Stop exercising if you experience uterine contractions.  Avoid heavy lifting.  Do   not exercise in extreme heat or humidity, or at high altitudes.  Wear low-heel, comfortable shoes.  Practice good posture.  You may continue to have sex unless your health care provider tells you otherwise. Relieving pain and discomfort  Take frequent breaks and rest with your legs elevated if you have leg cramps or low back pain.  Take warm sitz baths to soothe any pain or discomfort caused by hemorrhoids. Use hemorrhoid cream if your health care provider approves.  Wear a good support bra to prevent discomfort from breast tenderness.  If you develop varicose veins: ? Wear support pantyhose or compression stockings as told by your healthcare provider. ? Elevate your feet for 15 minutes, 3-4 times a day. Prenatal care  Write down your questions. Take them to your prenatal visits.  Keep all your prenatal visits as told by your health care provider. This is important. Safety  Wear your seat belt at all times when driving.  Make  a list of emergency phone numbers, including numbers for family, friends, the hospital, and police and fire departments. General instructions  Avoid cat litter boxes and soil used by cats. These carry germs that can cause birth defects in the baby. If you have a cat, ask someone to clean the litter box for you.  Do not travel far distances unless it is absolutely necessary and only with the approval of your health care provider.  Do not use hot tubs, steam rooms, or saunas.  Do not drink alcohol.  Do not use any products that contain nicotine or tobacco, such as cigarettes and e-cigarettes. If you need help quitting, ask your health care provider.  Do not use any medicinal herbs or unprescribed drugs. These chemicals affect the formation and growth of the baby.  Do not douche or use tampons or scented sanitary pads.  Do not cross your legs for long periods of time.  To prepare for the arrival of your baby: ? Take prenatal classes to understand, practice, and ask questions about labor and delivery. ? Make a trial run to the hospital. ? Visit the hospital and tour the maternity area. ? Arrange for maternity or paternity leave through employers. ? Arrange for family and friends to take care of pets while you are in the hospital. ? Purchase a rear-facing car seat and make sure you know how to install it in your car. ? Pack your hospital bag. ? Prepare the baby's nursery. Make sure to remove all pillows and stuffed animals from the baby's crib to prevent suffocation.  Visit your dentist if you have not gone during your pregnancy. Use a soft toothbrush to brush your teeth and be gentle when you floss. Contact a health care provider if:  You are unsure if you are in labor or if your water has broken.  You become dizzy.  You have mild pelvic cramps, pelvic pressure, or nagging pain in your abdominal area.  You have lower back pain.  You have persistent nausea, vomiting, or diarrhea.   You have an unusual or bad smelling vaginal discharge.  You have pain when you urinate. Get help right away if:  Your water breaks before 37 weeks.  You have regular contractions less than 5 minutes apart before 37 weeks.  You have a fever.  You are leaking fluid from your vagina.  You have spotting or bleeding from your vagina.  You have severe abdominal pain or cramping.  You have rapid weight loss or weight gain.  You have  shortness of breath with chest pain.  You notice sudden or extreme swelling of your face, hands, ankles, feet, or legs.  Your baby makes fewer than 10 movements in 2 hours.  You have severe headaches that do not go away when you take medicine.  You have vision changes. Summary  The third trimester is from week 28 through week 40, months 7 through 9. The third trimester is a time when the unborn baby (fetus) is growing rapidly.  During the third trimester, your discomfort may increase as you and your baby continue to gain weight. You may have abdominal, leg, and back pain, sleeping problems, and an increased need to urinate.  During the third trimester your breasts will keep growing and they will continue to become tender. A yellow fluid (colostrum) may leak from your breasts. This is the first milk you are producing for your baby.  False labor is a condition in which you feel small, irregular tightenings of the muscles in the womb (contractions) that eventually go away. These are called Braxton Hicks contractions. Contractions may last for hours, days, or even weeks before true labor sets in.  Signs of labor can include: abdominal cramps; regular contractions that start at 10 minutes apart and become stronger and more frequent with time; watery or bloody mucus discharge that comes from the vagina; increased pelvic pressure and dull back pain; and leaking of amniotic fluid. This information is not intended to replace advice given to you by your health  care provider. Make sure you discuss any questions you have with your health care provider. Document Released: 01/21/2001 Document Revised: 05/20/2018 Document Reviewed: 03/04/2016 Elsevier Patient Education  2020 [employer].

## 2019-01-19 NOTE — Progress Notes (Signed)
ROB/US C/o pelvic pain, lower back pain and pressure GBS/Aptima  Denies lof, no vb Good FM

## 2019-01-21 LAB — CERVICOVAGINAL ANCILLARY ONLY
Chlamydia: NEGATIVE
Comment: NEGATIVE
Comment: NORMAL
Neisseria Gonorrhea: NEGATIVE

## 2019-01-23 LAB — CULTURE, BETA STREP (GROUP B ONLY): Strep Gp B Culture: NEGATIVE

## 2019-01-28 ENCOUNTER — Encounter: Payer: Self-pay | Admitting: Obstetrics and Gynecology

## 2019-01-28 ENCOUNTER — Other Ambulatory Visit: Payer: Self-pay

## 2019-01-28 ENCOUNTER — Ambulatory Visit (INDEPENDENT_AMBULATORY_CARE_PROVIDER_SITE_OTHER): Payer: Medicare Other | Admitting: Obstetrics and Gynecology

## 2019-01-28 ENCOUNTER — Inpatient Hospital Stay
Admission: EM | Admit: 2019-01-28 | Discharge: 2019-01-28 | Disposition: A | Discharge status: Home / Self Care | Payer: Medicare Other | Attending: Obstetrics and Gynecology | Admitting: Obstetrics and Gynecology

## 2019-01-28 VITALS — BP 104/70 | Wt 253.0 lb

## 2019-01-28 DIAGNOSIS — O26843 Uterine size-date discrepancy, third trimester: Secondary | ICD-10-CM

## 2019-01-28 DIAGNOSIS — Z87891 Personal history of nicotine dependence: Secondary | ICD-10-CM | POA: Insufficient documentation

## 2019-01-28 DIAGNOSIS — F259 Schizoaffective disorder, unspecified: Secondary | ICD-10-CM | POA: Diagnosis not present

## 2019-01-28 DIAGNOSIS — R102 Pelvic and perineal pain: Secondary | ICD-10-CM

## 2019-01-28 DIAGNOSIS — F329 Major depressive disorder, single episode, unspecified: Secondary | ICD-10-CM | POA: Diagnosis not present

## 2019-01-28 DIAGNOSIS — O0990 Supervision of high risk pregnancy, unspecified, unspecified trimester: Secondary | ICD-10-CM

## 2019-01-28 DIAGNOSIS — O99213 Obesity complicating pregnancy, third trimester: Secondary | ICD-10-CM

## 2019-01-28 DIAGNOSIS — O99013 Anemia complicating pregnancy, third trimester: Secondary | ICD-10-CM

## 2019-01-28 DIAGNOSIS — Z3A37 37 weeks gestation of pregnancy: Secondary | ICD-10-CM | POA: Insufficient documentation

## 2019-01-28 DIAGNOSIS — O99343 Other mental disorders complicating pregnancy, third trimester: Secondary | ICD-10-CM | POA: Diagnosis not present

## 2019-01-28 DIAGNOSIS — O368190 Decreased fetal movements, unspecified trimester, not applicable or unspecified: Secondary | ICD-10-CM | POA: Diagnosis present

## 2019-01-28 DIAGNOSIS — O0993 Supervision of high risk pregnancy, unspecified, third trimester: Secondary | ICD-10-CM

## 2019-01-28 DIAGNOSIS — O368130 Decreased fetal movements, third trimester, not applicable or unspecified: Secondary | ICD-10-CM

## 2019-01-28 LAB — POCT URINALYSIS DIPSTICK OB
Glucose, UA: NEGATIVE
POC,PROTEIN,UA: NEGATIVE

## 2019-01-28 NOTE — Progress Notes (Signed)
Dr. Glennon Mac, MD notified of patient's arrival and triage report. Provider notified of inability to keep baby on monitor but fht noted. Provider urged nursing to continue trying to get NST and he would assess patient shortly.  Patient stable.

## 2019-01-28 NOTE — Progress Notes (Signed)
ROB- pelvic pain for the last 3-4 weeks (happened last pregnancy too), cervix check

## 2019-01-28 NOTE — OB Triage Note (Signed)
Discharge instructions provided and reviewed.  Follow up care discussed.  Pt verbalized understanding.

## 2019-01-28 NOTE — OB Triage Note (Signed)
Pt. Presented to L/D for decreased fetal movement that began this morning. Upon arrival to unit, movement was felt. FHT 143. No bleeding or LOF. Pelvic pain rated 7/10 noted, worsening with activity that began a few weeks prior. VSS. Will continue to monitor.

## 2019-01-28 NOTE — Discharge Summary (Signed)
See final progress note. 

## 2019-01-28 NOTE — Final Progress Note (Signed)
Physician Final Progress Note  Patient ID: Susan [name] MRN: [id number] DOB/AGE: 06/18/1985 33 y.o.  Admit date: 01/28/2019 Admitting provider: Will Bonnet, MD Discharge date: 01/28/2019   Admission Diagnoses:  1) intrauterine pregnancy at [redacted]w[redacted]d  2) Decreased fetal movement  Discharge Diagnoses:  1) intrauterine pregnancy at [redacted]w[redacted]d  2) Decreased fetal movement  History of Present Illness: The patient is a 33 y.o. female [phone] at [redacted]w[redacted]d who presents for decreased fetal movement. She was evaluated in the office today. She reported a decrease in fetal movement. There was an attempt to obtain an NST in the office today. However, the fetus was difficult to trace. A recommendation was made to come to L&D for further monitoring. At L&D she reports fetal movement since arrival. She notes no contractions, no leakage of fluid, and no vaginal bleeding.   Past Medical History:  Diagnosis Date  . Depression   . Pituitary microadenoma ([place])   . Schizoaffective disorder [facility])     Past Surgical History:  Procedure Laterality Date  . NO PAST SURGERIES      No current facility-administered medications on file prior to encounter.   Current Outpatient Medications on File Prior to Encounter  Medication Sig Dispense Refill  . DULoxetine (CYMBALTA) 30 MG capsule     . gabapentin (NEURONTIN) 300 MG capsule     . LATUDA 80 MG TABS tablet Take 80 mg by mouth at bedtime.    . prenatal vitamin w/FE, FA (PRENATAL 1 + 1) 27-1 MG TABS tablet Take by mouth.    . traZODone (DESYREL) 50 MG tablet       No Known Allergies  Social History   Socioeconomic History  . Marital status: Single    Spouse name: Not on file  . Number of children: Not on file  . Years of education: Not on file  . Highest education level: Not on file  Occupational History  . Not on file  Tobacco Use  . Smoking status: Former Smoker    Types: Cigarettes  . Smokeless tobacco: Never Used  Substance and Sexual  Activity  . Alcohol use: No  . Drug use: No  . Sexual activity: Yes    Comment: planning surgical  Other Topics Concern  . Not on file  Social History Narrative  . Not on file   Social Determinants of Health   Financial Resource Strain:   . Difficulty of Paying Living Expenses: Not on file  Food Insecurity:   . Worried About Charity fundraiser in the Last Year: Not on file  . Ran Out of Food in the Last Year: Not on file  Transportation Needs:   . Lack of Transportation (Medical): Not on file  . Lack of Transportation (Non-Medical): Not on file  Physical Activity:   . Days of Exercise per Week: Not on file  . Minutes of Exercise per Session: Not on file  Stress:   . Feeling of Stress : Not on file  Social Connections:   . Frequency of Communication with Friends and Family: Not on file  . Frequency of Social Gatherings with Friends and Family: Not on file  . Attends Religious Services: Not on file  . Active Member of Clubs or Organizations: Not on file  . Attends Archivist Meetings: Not on file  . Marital Status: Not on file  Intimate Partner Violence:   . Fear of Current or Ex-Partner: Not on file  . Emotionally Abused: Not on file  .  Physically Abused: Not on file  . Sexually Abused: Not on file    Family History  Problem Relation Age of Onset  . Diabetes type II Mother   . Depression Mother   . Hypertension Paternal Grandmother   . Diabetes type II Paternal Grandmother      Review of Systems  Constitutional: Negative.   HENT: Negative.   Eyes: Negative.   Respiratory: Negative.   Cardiovascular: Negative.   Gastrointestinal: Negative.   Genitourinary: Negative.   Musculoskeletal: Negative.   Skin: Negative.   Neurological: Negative.   Psychiatric/Behavioral: Negative.      Physical Exam: BP 118/75 (BP Location: Right Arm)   Pulse 99   Temp 98.2 F (36.8 C) (Oral)   Resp 16   Ht 5\' 7"  (1.702 m)   Wt 114.8 kg   SpO2 100%   BMI 39.63  kg/m   Physical Exam Constitutional:      General: She is not in acute distress.    Appearance: Normal appearance.  HENT:     Head: Normocephalic and atraumatic.  Eyes:     General: No scleral icterus.    Conjunctiva/sclera: Conjunctivae normal.  Neurological:     General: No focal deficit present.     Mental Status: She is alert and oriented to person, place, and time.     Cranial Nerves: No cranial nerve deficit.  Psychiatric:        Mood and Affect: Mood normal.        Behavior: Behavior normal.        Judgment: Judgment normal.     Consults: None  Significant Findings/ Diagnostic Studies: none  Procedures:  NST Baseline FHR: 130 beats/min Variability: moderate Accelerations: present Decelerations: absent Tocometry: quiet  Interpretation:  INDICATIONS: decreased fetal movement RESULTS:  A NST procedure was performed with FHR monitoring and a normal baseline established, appropriate time of 20-40 minutes of evaluation, and accels >2 seen w 15x15 characteristics.  Results show a REACTIVE NST.    Hospital Course: The patient was admitted to Labor and Delivery Triage for observation. She had normal vital signs. The fetal tracing was reactive. She had no other complaints. So, after the reassurance of a reactive NST, she was discharged in stable condition.  Of note, a bedside ultrasound was performed and the fetus was cephalic.  A fluid assessment was not performed, nor was a growth u/s.   Discharge Condition: stable  Disposition: Discharge disposition: 01-Home or Self Care       Diet: Regular diet  Discharge Activity: Activity as tolerated   Allergies as of 01/28/2019   No Known Allergies     Medication List    TAKE these medications   DULoxetine 30 MG capsule Commonly known as: CYMBALTA   gabapentin 300 MG capsule Commonly known as: NEURONTIN   Latuda 80 MG Tabs tablet Generic drug: lurasidone Take 80 mg by mouth at bedtime.   prenatal vitamin  w/FE, FA 27-1 MG Tabs tablet Take by mouth.   traZODone 50 MG tablet Commonly known as: DESYREL        Total time spent taking care of this patient: 20 minutes  Signed: Prentice Docker, MD  01/28/2019, 8:20 PM
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
## 2019-01-28 NOTE — Patient Instructions (Signed)
Covid testing: Will call patient Induction: 02/07/2019 at 5 am, enter through the medical mall  Labor Induction  Labor induction is when steps are taken to cause a pregnant woman to begin the labor process. Most women go into labor on their own between 37 weeks and 42 weeks of pregnancy. When this does not happen or when there is a medical need for labor to begin, steps may be taken to induce labor. Labor induction causes a pregnant woman's uterus to contract. It also causes the cervix to soften (ripen), open (dilate), and thin out (efface). Usually, labor is not induced before 39 weeks of pregnancy unless there is a medical reason to do so. Your health care provider will determine if labor induction is needed. Before inducing labor, your health care provider will consider a number of factors, including:  Your medical condition and your baby's.  How many weeks along you are in your pregnancy.  How mature your baby's lungs are.  The condition of your cervix.  The position of your baby.  The size of your birth canal. What are some reasons for labor induction? Labor may be induced if:  Your health or your baby's health is at risk.  Your pregnancy is overdue by 1 week or more.  Your water breaks but labor does not start on its own.  There is a low amount of amniotic fluid around your baby. You may also choose (elect) to have labor induced at a certain time. Generally, elective labor induction is done no earlier than 39 weeks of pregnancy. What methods are used for labor induction? Methods used for labor induction include:  Prostaglandin medicine. This medicine starts contractions and causes the cervix to dilate and ripen. It can be taken by mouth (orally) or by being inserted into the vagina (suppository).  Inserting a small, thin tube (catheter) with a balloon into the vagina and then expanding the balloon with water to dilate the cervix.  Stripping the membranes. In this method,  your health care provider gently separates amniotic sac tissue from the cervix. This causes the cervix to stretch, which in turn causes the release of a hormone called progesterone. The hormone causes the uterus to contract. This procedure is often done during an office visit, after which you will be sent home to wait for contractions to begin.  Breaking the water. In this method, your health care provider uses a small instrument to make a small hole in the amniotic sac. This eventually causes the amniotic sac to break. Contractions should begin after a few hours.  Medicine to trigger or strengthen contractions. This medicine is given through an IV that is inserted into a vein in your arm. Except for membrane stripping, which can be done in a clinic, labor induction is done in the hospital so that you and your baby can be carefully monitored. How long does it take for labor to be induced? The length of time it takes to induce labor depends on how ready your body is for labor. Some inductions can take up to 2-3 days, while others may take less than a day. Induction may take longer if:  You are induced early in your pregnancy.  It is your first pregnancy.  Your cervix is not ready. What are some risks associated with labor induction? Some risks associated with labor induction include:  Changes in fetal heart rate, such as being too high, too low, or irregular (erratic).  Failed induction.  Infection in the mother or the  baby.  Increased risk of having a cesarean delivery.  Fetal death.  Breaking off (abruption) of the placenta from the uterus (rare).  Rupture of the uterus (very rare). When induction is needed for medical reasons, the benefits of induction generally outweigh the risks. What are some reasons for not inducing labor? Labor induction should not be done if:  Your baby does not tolerate contractions.  You have had previous surgeries on your uterus, such as a myomectomy,  removal of fibroids, or a vertical scar from a previous cesarean delivery.  Your placenta lies very low in your uterus and blocks the opening of the cervix (placenta previa).  Your baby is not in a head-down position.  The umbilical cord drops down into the birth canal in front of the baby.  There are unusual circumstances, such as the baby being very early (premature).  You have had more than 2 previous cesarean deliveries. Summary  Labor induction is when steps are taken to cause a pregnant woman to begin the labor process.  Labor induction causes a pregnant woman's uterus to contract. It also causes the cervix to ripen, dilate, and efface.  Labor is not induced before 39 weeks of pregnancy unless there is a medical reason to do so.  When induction is needed for medical reasons, the benefits of induction generally outweigh the risks. This information is not intended to replace advice given to you by your health care provider. Make sure you discuss any questions you have with your health care provider. Document Released: 06/18/2006 Document Revised: 01/30/2017 Document Reviewed: 03/12/2016 Elsevier Patient Education  2020 [employer].

## 2019-02-02 ENCOUNTER — Encounter: Payer: Self-pay | Admitting: Obstetrics & Gynecology

## 2019-02-02 ENCOUNTER — Ambulatory Visit (INDEPENDENT_AMBULATORY_CARE_PROVIDER_SITE_OTHER): Payer: Medicare Other | Admitting: Obstetrics & Gynecology

## 2019-02-02 ENCOUNTER — Other Ambulatory Visit: Payer: Self-pay

## 2019-02-02 VITALS — BP 100/60 | Temp 96.3°F | Wt 254.0 lb

## 2019-02-02 DIAGNOSIS — O0990 Supervision of high risk pregnancy, unspecified, unspecified trimester: Secondary | ICD-10-CM

## 2019-02-02 DIAGNOSIS — O0993 Supervision of high risk pregnancy, unspecified, third trimester: Secondary | ICD-10-CM

## 2019-02-02 DIAGNOSIS — Z3A38 38 weeks gestation of pregnancy: Secondary | ICD-10-CM

## 2019-02-02 DIAGNOSIS — O99213 Obesity complicating pregnancy, third trimester: Secondary | ICD-10-CM

## 2019-02-02 DIAGNOSIS — F251 Schizoaffective disorder, depressive type: Secondary | ICD-10-CM

## 2019-02-02 LAB — POCT URINALYSIS DIPSTICK OB
Glucose, UA: NEGATIVE
POC,PROTEIN,UA: NEGATIVE

## 2019-02-02 NOTE — Addendum Note (Signed)
Addended by: Quintella Baton D on: 02/02/2019 11:03 AM   Modules accepted: Orders

## 2019-02-02 NOTE — Patient Instructions (Addendum)
PRE ADMISSION TESTING For Covid, prior to procedure Thursday 9:00-10:00 [facility] entrance (drive up)  Results in 48-72 hours You will not receive notification if test results are negative. If positive for Covid19, your provider will notify you by phone, with additional instructions.   Early Elective Birth  Early elective birth refers to making a choice to have a baby before the time the baby is due. Pregnancy is considered full term from 39 weeks through 40 weeks and 6 days. Early elective births can take place after 39 weeks of gestation. Most health care providers practice within the guidelines of delivering a baby no later than 42 weeks of pregnancy (gestation) and no earlier than 39 weeks. Generally, the longer the baby stays inside the uterus to reach full term, the lower the risks are to both the baby and the mother. In some cases, labor may be induced before 39 weeks due to health risks to the mother or baby. Induction of labor refers to the use of medicines to bring about contractions and labor in a pregnant woman. Medical reasons for having early birth It may be safer to induce labor before 39 weeks of gestation if:  A woman is carrying more than 1 baby. Twins may be delivered at 83 weeks of gestation.  A woman is having complications during pregnancy, such as: ? High blood pressure caused by pregnancy (preeclampsia). ? Bleeding. ? Infection.  There are conditions affecting the baby's health, such as: ? Intrauterine growth restriction (IUGR), in which something is preventing the baby from growing well. ? Abnormal fetal heart rate. ? Lack of fluid that surrounds the baby (oligohydramnios). ? Problems with the placenta.  Fluid that surrounds the baby (amniotic fluid) is leaking. There are many other medical reasons why you may need to have early birth. You and your health care provider will discuss your risks to determine whether there is a medical reason for you to  deliver your baby early. Reasons to avoid early elective birth Sometimes early birth is not the best choice. It may not be a good idea if:  It is chosen just for convenience.  You want the baby to be born on a certain date, like a holiday. You are more likely to need a cesarean delivery before 39 weeks of gestation. Having a cesarean section may cause problems such as:  Infection.  Bleeding.  Longer recovery time. Babies born early term (between 70 and 3 weeks and 6 days gestation):  May need special care in the nursery or in a neonatal intensive care unit (NICU).  Have a greater risk for: ? Infection. ? Brain damage or bleeding inside the brain. ? Feeding problems. ? Breathing problems. ? Problems controlling their temperature. ? Slow physical and mental development. ? Dying during their first year of life. General tips for a healthy pregnancy Good prenatal care reduces the risk of problems for you and your baby, and may help you to avoid preterm birth. During your pregnancy, do the following:  Take a prenatal vitamin containing folic acid. Take any additional vitamins as recommended by your health care provider, such as calcium supplements.  Make sure you are up to date on your vaccinations.  Eat a healthy diet that includes: ? Fresh fruits and vegetables. ? Lean proteins. ? Calcium-rich foods, such as milk, yogurt, hard cheeses, and dark, leafy greens. ? Whole grains.  Follow instructions from your health care provider about managing any medical conditions you may have during your pregnancy.  Drink enough water to keep your urine clear or pale yellow. For many women, this may be 10 or more 8-oz glasses of water each day. Keeping yourself hydrated may help prevent preterm contractions.  Do not use alcohol, tobacco products, or drugs. This includes cigarettes and e-cigarettes. If you need help quitting, ask your health care provider.  Exercise regularly and get plenty  of rest. Unless told otherwise by your health care provider, try to get 30 minutes of moderate exercise most days of the week.  Keep all prenatal visits as told by your health care provider. This is important. Summary  A full term pregnancy is usually best for baby and mother. In some cases, early elective birth may be necessary due to health risks to mother or baby.  Generally, the longer the baby stays inside the uterus until it is full term, the lower the risks are to both the baby and the mother.  An early elective birth may lead to a cesarean delivery. This can lead to other problems for the mother and baby.  Keep all prenatal visits as told by your health care provider. This is important. Good prenatal care reduces the risk of problems for you and your baby, and may help you avoid preterm birth. This information is not intended to replace advice given to you by your health care provider. Make sure you discuss any questions you have with your health care provider. Document Released: 10/09/2010 Document Revised: 03/03/2016 Document Reviewed: 03/03/2016 Elsevier Patient Education  2020 [employer].

## 2019-02-02 NOTE — Progress Notes (Signed)
History and Physical  Susan [name] is a 33 y.o. [id number] [redacted]w[redacted]d  for Induction of Labor scheduled due to Favorable cervix at term .   See labor record for pregnancy highlights.  No recent pain, bleeding, ruptured membranes, or other signs of progressing labor.  PMHx: She  has a past medical history of Depression, Pituitary microadenoma ([place]), and Schizoaffective disorder ([place]). Also,  has a past surgical history that includes No past surgeries., family history includes Depression in her mother; Diabetes type II in her mother and paternal grandmother; Hypertension in her paternal grandmother.,  reports that she has quit smoking. Her smoking use included cigarettes. She has never used smokeless tobacco. She reports that she does not drink alcohol or use drugs. She has a current medication list which includes the following prescription(s): duloxetine, gabapentin, latuda, prenatal vitamin w/fe, fa, and trazodone. Also, has No Known Allergies. OB History  Gravida Para Term Preterm AB Living  6 4 4  0 1 4  SAB TAB Ectopic Multiple Live Births  0 1     4    # Outcome Date GA Lbr Len/2nd Weight Sex Delivery Anes PTL Lv  6 Current           5 Term 04/07/17 [redacted]w[redacted]d  6 lb 3.8 oz (2.829 kg) F Vag-Spont   LIV  4 TAB 03/17/14     TAB     3 Term 11/08/13 [redacted]w[redacted]d  8 lb 3 oz (3.714 kg) M Vag-Spont   LIV     Complications: Unstable lie of fetus  2 Term 10/31/10 [redacted]w[redacted]d  6 lb 6 oz (2.892 kg) F Vag-Spont   LIV  1 Term 06/29/08 [redacted]w[redacted]d  6 lb 3 oz (2.807 kg) F Vag-Spont   LIV     Complications: Oligohydramnios  Patient denies any other pertinent gynecologic issues.   Review of Systems  Constitutional: Negative for chills, fever and malaise/fatigue.  HENT: Negative for congestion, sinus pain and sore throat.   Eyes: Negative for blurred vision and pain.  Respiratory: Negative for cough and wheezing.   Cardiovascular: Negative for chest pain and leg swelling.  Gastrointestinal: Negative for abdominal pain,  constipation, diarrhea, heartburn, nausea and vomiting.  Genitourinary: Negative for dysuria, frequency, hematuria and urgency.  Musculoskeletal: Negative for back pain, joint pain, myalgias and neck pain.  Skin: Negative for itching and rash.  Neurological: Negative for dizziness, tremors and weakness.  Endo/Heme/Allergies: Does not bruise/bleed easily.  Psychiatric/Behavioral: Negative for depression. The patient is not nervous/anxious and does not have insomnia.     Objective: BP 100/60   Temp (!) 96.3 F (35.7 C)   Wt 254 lb (115.2 kg)   BMI 39.78 kg/m  Physical Exam Constitutional:      General: She is not in acute distress.    Appearance: She is well-developed.  Genitourinary:     Pelvic exam was performed with patient supine.     Vagina, uterus and rectum normal.     No lesions in the vagina.     No vaginal bleeding.     No cervical motion tenderness, friability, lesion or polyp.     Uterus is mobile.     Uterus is not enlarged.     No uterine mass detected.    Uterus is midaxial.     No right or left adnexal mass present.     Right adnexa not tender.     Left adnexa not tender.     Genitourinary Comments: SVE 1/30/-3  HENT:  Head: Normocephalic and atraumatic. No laceration.     Right Ear: Hearing normal.     Left Ear: Hearing normal.     Mouth/Throat:     Pharynx: Uvula midline.  Eyes:     Pupils: Pupils are equal, round, and reactive to light.  Neck:     Thyroid: No thyromegaly.  Cardiovascular:     Rate and Rhythm: Normal rate and regular rhythm.     Heart sounds: No murmur. No friction rub. No gallop.   Pulmonary:     Effort: Pulmonary effort is normal. No respiratory distress.     Breath sounds: Normal breath sounds. No wheezing.  Chest:     Breasts:        Right: No mass, skin change or tenderness.        Left: No mass, skin change or tenderness.  Abdominal:     General: Bowel sounds are normal. There is no distension.     Palpations: Abdomen  is soft.     Tenderness: There is no abdominal tenderness. There is no rebound.  Musculoskeletal:        General: Normal range of motion.     Cervical back: Normal range of motion and neck supple.  Neurological:     Mental Status: She is alert and oriented to person, place, and time.     Cranial Nerves: No cranial nerve deficit.  Skin:    General: Skin is warm and dry.  Psychiatric:        Judgment: Judgment normal.  Vitals reviewed.     Assessment: Term Pregnancy for Induction of Labor due to Favorable cervix at term.  Plan: Patient will undergo induction of labor with cervical ripening agents.   Also will need AROM when appropriate.  Patient has been fully informed of the pros and cons, risks and benefits of continued observation with fetal monitoring versus that of induction of labor.   She understands that there are uncommon risks to induction, which include but are not limited to : frequent or prolonged uterine contractions, fetal distress, uterine rupture, and lack of successful induction.  These risks include all methods including Pitocin and Misoprostol and Cervadil.  Patient understands that using Misoprostol for labor induction is an "off label" indication although it has been studied extensively for this purpose and is an accepted method of induction.  She also has been informed of the increased risks for Cesarean with induction and should induction not be successful.  Patient consents to the induction plan of management.  Plans to breast feed Plans bilateral tubal ligation for contraception (plans post partum BTL) TDaP UTD  Barnett Applebaum, MD, [facility], [facility] 02/02/2019  10:57 AM

## 2019-02-03 ENCOUNTER — Other Ambulatory Visit: Payer: Self-pay

## 2019-02-03 ENCOUNTER — Other Ambulatory Visit
Admission: RE | Admit: 2019-02-03 | Discharge: 2019-02-03 | Disposition: A | Discharge status: Home / Self Care | Payer: Medicare Other | Source: Ambulatory Visit | Attending: Obstetrics & Gynecology | Admitting: Obstetrics & Gynecology

## 2019-02-03 DIAGNOSIS — Z01812 Encounter for preprocedural laboratory examination: Secondary | ICD-10-CM | POA: Insufficient documentation

## 2019-02-03 DIAGNOSIS — Z20828 Contact with and (suspected) exposure to other viral communicable diseases: Secondary | ICD-10-CM | POA: Diagnosis not present

## 2019-02-03 LAB — SARS CORONAVIRUS 2 (TAT 6-24 HRS): SARS Coronavirus 2: NEGATIVE

## 2019-02-07 ENCOUNTER — Other Ambulatory Visit: Payer: Self-pay

## 2019-02-07 ENCOUNTER — Encounter: Payer: Self-pay | Admitting: Obstetrics & Gynecology

## 2019-02-07 ENCOUNTER — Inpatient Hospital Stay
Admission: EM | Admit: 2019-02-07 | Discharge: 2019-02-09 | DRG: 798 | Disposition: A | Discharge status: Home / Self Care | Payer: Medicare Other | Attending: Advanced Practice Midwife | Admitting: Advanced Practice Midwife

## 2019-02-07 DIAGNOSIS — Z302 Encounter for sterilization: Secondary | ICD-10-CM

## 2019-02-07 DIAGNOSIS — Z20828 Contact with and (suspected) exposure to other viral communicable diseases: Secondary | ICD-10-CM | POA: Diagnosis present

## 2019-02-07 DIAGNOSIS — O26893 Other specified pregnancy related conditions, third trimester: Secondary | ICD-10-CM | POA: Diagnosis present

## 2019-02-07 DIAGNOSIS — Z3A39 39 weeks gestation of pregnancy: Secondary | ICD-10-CM

## 2019-02-07 DIAGNOSIS — O0990 Supervision of high risk pregnancy, unspecified, unspecified trimester: Secondary | ICD-10-CM

## 2019-02-07 DIAGNOSIS — Z641 Problems related to multiparity: Secondary | ICD-10-CM

## 2019-02-07 LAB — CBC
HCT: 33.2 % — ABNORMAL LOW (ref 36.0–46.0)
Hemoglobin: 11.2 g/dL — ABNORMAL LOW (ref 12.0–15.0)
MCH: 28 pg (ref 26.0–34.0)
MCHC: 33.7 g/dL (ref 30.0–36.0)
MCV: 83 fL (ref 80.0–100.0)
Platelets: 134 10*3/uL — ABNORMAL LOW (ref 150–400)
RBC: 4 MIL/uL (ref 3.87–5.11)
RDW: 13.6 % (ref 11.5–15.5)
WBC: 5.4 10*3/uL (ref 4.0–10.5)
nRBC: 0 % (ref 0.0–0.2)

## 2019-02-07 LAB — TYPE AND SCREEN
ABO/RH(D): O POS
Antibody Screen: NEGATIVE

## 2019-02-07 MED ORDER — ONDANSETRON HCL 4 MG/2ML IJ SOLN: 4.0000 mg | Freq: Four times a day (QID) | INTRAMUSCULAR | Status: DC | PRN

## 2019-02-07 MED ORDER — TERBUTALINE SULFATE 1 MG/ML IJ SOLN: 0.2500 mg | Freq: Once | INTRAMUSCULAR | Status: DC | PRN

## 2019-02-07 MED ORDER — LURASIDONE HCL 80 MG PO TABS
80.0000 mg | ORAL_TABLET | Freq: Every day | ORAL | Status: DC
  Administered 2019-02-07: 80 mg via ORAL
  Filled 2019-02-07: qty 1

## 2019-02-07 MED ORDER — MISOPROSTOL 25 MCG QUARTER TABLET
25.0000 ug | ORAL_TABLET | Freq: Once | ORAL | Status: AC
  Administered 2019-02-07: 25 ug via BUCCAL

## 2019-02-07 MED ORDER — ACETAMINOPHEN 325 MG PO TABS: 650.0000 mg | ORAL_TABLET | ORAL | Status: DC | PRN

## 2019-02-07 MED ORDER — GABAPENTIN 300 MG PO CAPS
300.0000 mg | ORAL_CAPSULE | Freq: Every day | ORAL | Status: DC
  Administered 2019-02-07: 300 mg via ORAL
  Filled 2019-02-07: qty 1

## 2019-02-07 MED ORDER — LIDOCAINE HCL (PF) 1 % IJ SOLN: 30.0000 mL | INTRAMUSCULAR | Status: DC | PRN

## 2019-02-07 MED ORDER — LIDOCAINE HCL (PF) 1 % IJ SOLN
INTRAMUSCULAR | Status: AC
  Filled 2019-02-07: qty 30

## 2019-02-07 MED ORDER — MISOPROSTOL 200 MCG PO TABS
ORAL_TABLET | ORAL | Status: AC
  Administered 2019-02-07: 25 ug via VAGINAL
  Filled 2019-02-07: qty 4

## 2019-02-07 MED ORDER — BUTORPHANOL TARTRATE 1 MG/ML IJ SOLN: 1.0000 mg | INTRAMUSCULAR | Status: DC | PRN

## 2019-02-07 MED ORDER — LACTATED RINGERS IV SOLN: 500.0000 mL | INTRAVENOUS | Status: DC | PRN

## 2019-02-07 MED ORDER — AMMONIA AROMATIC IN INHA
RESPIRATORY_TRACT | Status: AC
  Filled 2019-02-07: qty 10

## 2019-02-07 MED ORDER — DULOXETINE HCL 30 MG PO CPEP
30.0000 mg | ORAL_CAPSULE | Freq: Every day | ORAL | Status: DC
  Administered 2019-02-07: 30 mg via ORAL
  Filled 2019-02-07: qty 1

## 2019-02-07 MED ORDER — LACTATED RINGERS IV SOLN: INTRAVENOUS | Status: DC

## 2019-02-07 MED ORDER — OXYTOCIN BOLUS FROM INFUSION
500.0000 mL | Freq: Once | INTRAVENOUS | Status: AC
  Administered 2019-02-08: 500 mL/h via INTRAVENOUS

## 2019-02-07 MED ORDER — MISOPROSTOL 25 MCG QUARTER TABLET
25.0000 ug | ORAL_TABLET | ORAL | Status: DC | PRN
  Administered 2019-02-07 (×2): 25 ug via VAGINAL
  Filled 2019-02-07 (×4): qty 1

## 2019-02-07 MED ORDER — OXYTOCIN 40 UNITS IN NORMAL SALINE INFUSION - SIMPLE MED
2.5000 [IU]/h | INTRAVENOUS | Status: DC
  Filled 2019-02-07: qty 1000

## 2019-02-07 MED ORDER — OXYTOCIN 10 UNIT/ML IJ SOLN
INTRAMUSCULAR | Status: AC
  Filled 2019-02-07: qty 2

## 2019-02-07 MED ORDER — OXYTOCIN 40 UNITS IN NORMAL SALINE INFUSION - SIMPLE MED
1.0000 m[IU]/min | INTRAVENOUS | Status: DC
  Administered 2019-02-07: 21:00:00 2 m[IU]/min via INTRAVENOUS
  Filled 2019-02-07: qty 1000

## 2019-02-07 NOTE — H&P (Addendum)
History and Physical  Update: 02/07/2019  H&P has been reviewed and updated. Patient is in stable condition. She is ready for elective IOL. She is feeling cramping s/p first dose of cytotec at 6:50 this morning.  Vital Signs: BP 115/76 (BP Location: Left Arm)   Pulse 97   Temp 98.3 F (36.8 C) (Oral)   Resp 18   Ht 5\' 7"  (1.702 m)   Wt 114.8 kg   BMI 39.63 kg/m  Constitutional: Well nourished, well developed female in no acute distress.  HEENT: normal Skin: Warm and dry.  Cardiovascular: Regular rate and rhythm.   Extremity: no edema  Respiratory: Clear to auscultation bilateral. Normal respiratory effort Abdomen: FHT present Back: no CVAT Neuro: DTRs 2+, Cranial nerves grossly intact Psych: Alert and Oriented x3. No memory deficits. Normal mood and affect.  MS: normal gait, normal bilateral lower extremity ROM/strength/stability.      Ref Range & Units 5 d ago  SARS Coronavirus 2 NEGATIVE NEGATIVE          Pelvic exam:  is not limited by body habitus EGBUS: within normal limits Vagina: within normal limits and with normal mucosa  Cervix: 1/50-60/-2  Toco: occasional contraction Fetal Well Being: 125 bpm, moderate variability, +accelerations, -decelerations  Patient was admitted to L&D this morning Labs drawn IV/fluids May eat a light breakfast- had this at 8:30 this morning Fetal monitoring  Continue induction with 2nd dose of cytotec: 25 mcg cervical, 25 mcg buccal  Plan of care discussed with patient and her support person  Rod Can, CNM

## 2019-02-07 NOTE — Progress Notes (Signed)
  Labor Progress Note   33 y.o. [id number] @ [redacted]w[redacted]d , admitted for  Pregnancy, Labor Management.   Subjective:  Coping well with contractions  Objective:  BP 121/82   Pulse 90   Temp 97.7 F (36.5 C) (Oral)   Resp 16   Ht 5\' 7"  (1.702 m)   Wt 114.8 kg   BMI 39.63 kg/m  Abd: gravid, ND, FHT present, mild tenderness on exam Extr: no edema SVE: 2/70/-2,-1 Cervical sweep  EFM: FHR: 135 bpm, variability: moderate,  accelerations:  Present,  decelerations:  Absent Toco: Frequency: Every 3-4 minutes Labs: I have reviewed the patient's lab results.   Assessment & Plan:  [id number] @ 104w0d, admitted for  Pregnancy and Labor/Delivery Management  1. Pain management: none. 2. FWB: FHT category I.  3. ID: GBS negative 4. Labor management: start pitocin  All discussed with patient, see orders   Rod Can, [facility] 02/07/2019  8:41 PM

## 2019-02-08 ENCOUNTER — Inpatient Hospital Stay: Payer: Medicare Other | Admitting: Anesthesiology

## 2019-02-08 ENCOUNTER — Encounter
Admission: EM | Disposition: A | Discharge status: Home / Self Care | Payer: Self-pay | Source: Home / Self Care | Attending: Advanced Practice Midwife

## 2019-02-08 ENCOUNTER — Other Ambulatory Visit: Payer: Self-pay

## 2019-02-08 ENCOUNTER — Encounter: Payer: Self-pay | Admitting: Obstetrics & Gynecology

## 2019-02-08 DIAGNOSIS — Z302 Encounter for sterilization: Secondary | ICD-10-CM

## 2019-02-08 DIAGNOSIS — Z3A39 39 weeks gestation of pregnancy: Secondary | ICD-10-CM

## 2019-02-08 HISTORY — PX: TUBAL LIGATION: SHX77

## 2019-02-08 LAB — RPR: RPR Ser Ql: NONREACTIVE

## 2019-02-08 SURGERY — LIGATION, FALLOPIAN TUBE, POSTPARTUM
Anesthesia: General | Site: Abdomen | Laterality: Bilateral

## 2019-02-08 MED ORDER — GABAPENTIN 300 MG PO CAPS
300.0000 mg | ORAL_CAPSULE | Freq: Every day | ORAL | Status: DC
  Administered 2019-02-08: 300 mg via ORAL
  Filled 2019-02-08 (×2): qty 1

## 2019-02-08 MED ORDER — ONDANSETRON HCL 4 MG/2ML IJ SOLN
INTRAMUSCULAR | Status: AC
  Filled 2019-02-08: qty 2

## 2019-02-08 MED ORDER — SUGAMMADEX SODIUM 200 MG/2ML IV SOLN
INTRAVENOUS | Status: AC
  Filled 2019-02-08: qty 2

## 2019-02-08 MED ORDER — PROPOFOL 10 MG/ML IV BOLUS
INTRAVENOUS | Status: DC | PRN
  Administered 2019-02-08: 200 mg via INTRAVENOUS

## 2019-02-08 MED ORDER — LACTATED RINGERS IV SOLN: Freq: Once | INTRAVENOUS | Status: AC

## 2019-02-08 MED ORDER — DIBUCAINE (PERIANAL) 1 % EX OINT
1.0000 "application " | TOPICAL_OINTMENT | CUTANEOUS | Status: DC | PRN
  Filled 2019-02-08: qty 28

## 2019-02-08 MED ORDER — FERROUS SULFATE 325 (65 FE) MG PO TABS
325.0000 mg | ORAL_TABLET | Freq: Every day | ORAL | Status: DC
  Administered 2019-02-08 – 2019-02-09 (×2): 325 mg via ORAL
  Filled 2019-02-08 (×2): qty 1

## 2019-02-08 MED ORDER — KETOROLAC TROMETHAMINE 30 MG/ML IJ SOLN
INTRAMUSCULAR | Status: DC | PRN
  Administered 2019-02-08: 30 mg via INTRAVENOUS

## 2019-02-08 MED ORDER — DULOXETINE HCL 30 MG PO CPEP
30.0000 mg | ORAL_CAPSULE | Freq: Every day | ORAL | Status: DC
  Administered 2019-02-08: 30 mg via ORAL
  Filled 2019-02-08 (×2): qty 1

## 2019-02-08 MED ORDER — ONDANSETRON HCL 4 MG PO TABS: 4.0000 mg | ORAL_TABLET | ORAL | Status: DC | PRN

## 2019-02-08 MED ORDER — TETANUS-DIPHTH-ACELL PERTUSSIS 5-2.5-18.5 LF-MCG/0.5 IM SUSP: 0.5000 mL | Freq: Once | INTRAMUSCULAR | Status: DC

## 2019-02-08 MED ORDER — COCONUT OIL OIL
1.0000 "application " | TOPICAL_OIL | Status: DC | PRN
  Filled 2019-02-08: qty 120

## 2019-02-08 MED ORDER — LIDOCAINE HCL (CARDIAC) PF 100 MG/5ML IV SOSY
PREFILLED_SYRINGE | INTRAVENOUS | Status: DC | PRN
  Administered 2019-02-08: 100 mg via INTRAVENOUS

## 2019-02-08 MED ORDER — OXYCODONE-ACETAMINOPHEN 5-325 MG PO TABS: 1.0000 | ORAL_TABLET | ORAL | Status: DC | PRN

## 2019-02-08 MED ORDER — BUPIVACAINE HCL 0.5 % IJ SOLN
INTRAMUSCULAR | Status: DC | PRN
  Administered 2019-02-08: 10 mL

## 2019-02-08 MED ORDER — BENZOCAINE-MENTHOL 20-0.5 % EX AERO
1.0000 "application " | INHALATION_SPRAY | CUTANEOUS | Status: DC | PRN
  Filled 2019-02-08: qty 56

## 2019-02-08 MED ORDER — SENNOSIDES-DOCUSATE SODIUM 8.6-50 MG PO TABS
2.0000 | ORAL_TABLET | ORAL | Status: DC
  Administered 2019-02-09: 2 via ORAL
  Filled 2019-02-08: qty 2

## 2019-02-08 MED ORDER — BUPIVACAINE HCL (PF) 0.5 % IJ SOLN
INTRAMUSCULAR | Status: AC
  Filled 2019-02-08: qty 30

## 2019-02-08 MED ORDER — ONDANSETRON HCL 4 MG/2ML IJ SOLN: 4.0000 mg | INTRAMUSCULAR | Status: DC | PRN

## 2019-02-08 MED ORDER — SUGAMMADEX SODIUM 200 MG/2ML IV SOLN
INTRAVENOUS | Status: DC | PRN
  Administered 2019-02-08: 200 mg via INTRAVENOUS

## 2019-02-08 MED ORDER — DIPHENHYDRAMINE HCL 25 MG PO CAPS: 25.0000 mg | ORAL_CAPSULE | Freq: Four times a day (QID) | ORAL | Status: DC | PRN

## 2019-02-08 MED ORDER — MIDAZOLAM HCL 2 MG/2ML IJ SOLN
INTRAMUSCULAR | Status: AC
  Filled 2019-02-08: qty 2

## 2019-02-08 MED ORDER — IBUPROFEN 600 MG PO TABS
600.0000 mg | ORAL_TABLET | Freq: Four times a day (QID) | ORAL | Status: DC
  Administered 2019-02-08 – 2019-02-09 (×4): 600 mg via ORAL
  Filled 2019-02-08 (×4): qty 1

## 2019-02-08 MED ORDER — SUCCINYLCHOLINE CHLORIDE 20 MG/ML IJ SOLN
INTRAMUSCULAR | Status: DC | PRN
  Administered 2019-02-08: 120 mg via INTRAVENOUS

## 2019-02-08 MED ORDER — ACETAMINOPHEN 325 MG PO TABS: 650.0000 mg | ORAL_TABLET | ORAL | Status: DC | PRN

## 2019-02-08 MED ORDER — ONDANSETRON HCL 4 MG/2ML IJ SOLN
INTRAMUSCULAR | Status: DC | PRN
  Administered 2019-02-08: 4 mg via INTRAVENOUS

## 2019-02-08 MED ORDER — SIMETHICONE 80 MG PO CHEW
80.0000 mg | CHEWABLE_TABLET | ORAL | Status: DC | PRN
  Administered 2019-02-09: 80 mg via ORAL
  Filled 2019-02-08: qty 1

## 2019-02-08 MED ORDER — MIDAZOLAM HCL 2 MG/2ML IJ SOLN
INTRAMUSCULAR | Status: DC | PRN
  Administered 2019-02-08: 2 mg via INTRAVENOUS

## 2019-02-08 MED ORDER — ROCURONIUM BROMIDE 50 MG/5ML IV SOLN
INTRAVENOUS | Status: AC
  Filled 2019-02-08: qty 1

## 2019-02-08 MED ORDER — ROCURONIUM BROMIDE 100 MG/10ML IV SOLN
INTRAVENOUS | Status: DC | PRN
  Administered 2019-02-08: 10 mg via INTRAVENOUS

## 2019-02-08 MED ORDER — LIDOCAINE HCL (PF) 2 % IJ SOLN
INTRAMUSCULAR | Status: AC
  Filled 2019-02-08: qty 10

## 2019-02-08 MED ORDER — FENTANYL CITRATE (PF) 100 MCG/2ML IJ SOLN
INTRAMUSCULAR | Status: AC
  Filled 2019-02-08: qty 2

## 2019-02-08 MED ORDER — LACTATED RINGERS IV SOLN: INTRAVENOUS | Status: DC | PRN

## 2019-02-08 MED ORDER — PROPOFOL 10 MG/ML IV BOLUS
INTRAVENOUS | Status: AC
  Filled 2019-02-08: qty 20

## 2019-02-08 MED ORDER — PHENYLEPHRINE HCL (PRESSORS) 10 MG/ML IV SOLN
INTRAVENOUS | Status: DC | PRN
  Administered 2019-02-08 (×2): 100 ug via INTRAVENOUS
  Administered 2019-02-08: 200 ug via INTRAVENOUS
  Administered 2019-02-08: 100 ug via INTRAVENOUS

## 2019-02-08 MED ORDER — PRENATAL MULTIVITAMIN CH
1.0000 | ORAL_TABLET | Freq: Every day | ORAL | Status: DC
  Administered 2019-02-09: 1 via ORAL
  Filled 2019-02-08: qty 1

## 2019-02-08 MED ORDER — WITCH HAZEL-GLYCERIN EX PADS
1.0000 "application " | MEDICATED_PAD | CUTANEOUS | Status: DC | PRN
  Filled 2019-02-08: qty 100

## 2019-02-08 MED ORDER — DEXAMETHASONE SODIUM PHOSPHATE 10 MG/ML IJ SOLN
INTRAMUSCULAR | Status: DC | PRN
  Administered 2019-02-08: 10 mg via INTRAVENOUS

## 2019-02-08 MED ORDER — DEXAMETHASONE SODIUM PHOSPHATE 10 MG/ML IJ SOLN
INTRAMUSCULAR | Status: AC
  Filled 2019-02-08: qty 1

## 2019-02-08 MED ORDER — LURASIDONE HCL 80 MG PO TABS
80.0000 mg | ORAL_TABLET | Freq: Every day | ORAL | Status: DC
  Administered 2019-02-08: 80 mg via ORAL
  Filled 2019-02-08 (×2): qty 1

## 2019-02-08 MED ORDER — FENTANYL CITRATE (PF) 100 MCG/2ML IJ SOLN
INTRAMUSCULAR | Status: DC | PRN
  Administered 2019-02-08: 25 ug via INTRAVENOUS
  Administered 2019-02-08: 50 ug via INTRAVENOUS

## 2019-02-08 SURGICAL SUPPLY — 25 items
CHLORAPREP W/TINT 26 (MISCELLANEOUS) ×3 IMPLANT
COVER WAND RF STERILE (DRAPES) IMPLANT
DERMABOND ADVANCED (GAUZE/BANDAGES/DRESSINGS) ×2
DERMABOND ADVANCED .7 DNX12 (GAUZE/BANDAGES/DRESSINGS) ×1 IMPLANT
DRAPE LAPAROTOMY 100X77 ABD (DRAPES) ×3 IMPLANT
DRSG TEGADERM 2-3/8X2-3/4 SM (GAUZE/BANDAGES/DRESSINGS) ×3 IMPLANT
DRSG TEGADERM 4X4.75 (GAUZE/BANDAGES/DRESSINGS) ×3 IMPLANT
DRSG TELFA 4X3 1S NADH ST (GAUZE/BANDAGES/DRESSINGS) ×3 IMPLANT
ELECT CAUTERY BLADE 6.4 (BLADE) ×3 IMPLANT
ELECT REM PT RETURN 9FT ADLT (ELECTROSURGICAL) ×3
ELECTRODE REM PT RTRN 9FT ADLT (ELECTROSURGICAL) ×1 IMPLANT
GLOVE BIO SURGEON STRL SZ8 (GLOVE) ×3 IMPLANT
GOWN STRL REUS W/ TWL LRG LVL3 (GOWN DISPOSABLE) ×1 IMPLANT
GOWN STRL REUS W/ TWL XL LVL3 (GOWN DISPOSABLE) ×1 IMPLANT
GOWN STRL REUS W/TWL LRG LVL3 (GOWN DISPOSABLE) ×2
GOWN STRL REUS W/TWL XL LVL3 (GOWN DISPOSABLE) ×2
LABEL OR SOLS (LABEL) ×3 IMPLANT
NEEDLE HYPO 22GX1.5 SAFETY (NEEDLE) ×3 IMPLANT
NS IRRIG 500ML POUR BTL (IV SOLUTION) ×3 IMPLANT
PACK BASIN MINOR ARMC (MISCELLANEOUS) ×3 IMPLANT
STRAP SAFETY 5IN WIDE (MISCELLANEOUS) ×3 IMPLANT
SUT VIC AB 0 SH 27 (SUTURE) ×6 IMPLANT
SUT VIC AB 2-0 UR6 27 (SUTURE) ×3 IMPLANT
SUT VIC AB 4-0 PS2 18 (SUTURE) IMPLANT
SYR 10ML LL (SYRINGE) ×3 IMPLANT

## 2019-02-08 NOTE — Progress Notes (Signed)
Pt to OR.

## 2019-02-08 NOTE — Anesthesia Preprocedure Evaluation (Signed)
Anesthesia Evaluation  Patient identified by MRN, date of birth, ID band Patient awake    Reviewed: Allergy & Precautions, NPO status , Patient's Chart, lab work & pertinent test results  Airway Mallampati: II  TM Distance: >3 FB     Dental  (+) Teeth Intact   Pulmonary former smoker,    Pulmonary exam normal        Cardiovascular negative cardio ROS Normal cardiovascular exam     Neuro/Psych PSYCHIATRIC DISORDERS Depression Schizophrenia negative neurological ROS     GI/Hepatic negative GI ROS, Neg liver ROS,   Endo/Other  negative endocrine ROS  Renal/GU negative Renal ROS  negative genitourinary   Musculoskeletal negative musculoskeletal ROS (+)   Abdominal Normal abdominal exam  (+)   Peds negative pediatric ROS (+)  Hematology  (+) anemia ,   Anesthesia Other Findings Past Medical History: No date: Depression No date: Pituitary microadenoma ([facility]) No date: Schizoaffective disorder ([facility])  Reproductive/Obstetrics negative OB ROS                             Anesthesia Physical Anesthesia Plan  ASA: II  Anesthesia Plan: General   Post-op Pain Management:    Induction: Intravenous  PONV Risk Score and Plan:   Airway Management Planned: Oral ETT  Additional Equipment:   Intra-op Plan:   Post-operative Plan: Extubation in OR  Informed Consent: I have reviewed the patients History and Physical, chart, labs and discussed the procedure including the risks, benefits and alternatives for the proposed anesthesia with the patient or authorized representative who has indicated his/her understanding and acceptance.     Dental advisory given  Plan Discussed with: CRNA and Surgeon  Anesthesia Plan Comments:         Anesthesia Quick Evaluation

## 2019-02-08 NOTE — Discharge Summary (Signed)
OB Discharge Summary     Patient Name: Susan [name] DOB: [birth year] MRN: [id number]  Date of admission: 02/07/2019 Delivering provider: Rod Can, CNM  Date of Delivery: 02/08/2019  Date of discharge: 02/09/2019  Admitting diagnosis: [place] multipara [Z64.1], Induction of Labor Intrauterine pregnancy: [redacted]w[redacted]d     Secondary diagnosis: None     Discharge diagnosis: Term Pregnancy Delivered, encounter for sterilization                                                                                      Post partum procedures:postpartum tubal ligation  Augmentation: AROM, Pitocin and Cytotec  Complications: None  Hospital course:  Induction of Labor With Vaginal Delivery   33 y.o. yo [id number] at [redacted]w[redacted]d was admitted to the hospital 02/07/2019 for induction of labor.  Indication for induction: elective.  Patient had an uncomplicated labor course as follows: Membrane Rupture Time/Date: 5:43 AM ,02/08/2019   Patient had delivery of viable female 6:51 AM, 02/08/2019 Details of delivery can be found in separate delivery note.    Patient had a routine postpartum course. On day of delivery she underwent a postpartum tubal ligation without difficulty. On PPD#1 she was ambulating, tolerating PO, voiding spontaneously, her pain was well controlled with ibuprofen only.  She was otherwise stable for discharge.  Patient is discharged home 02/09/19.  Physical exam  Vitals:   02/08/19 2123 02/09/19 0029 02/09/19 0406 02/09/19 0833  BP: 108/67 110/72 110/72 (!) 128/92  Pulse:  81 80 73  Resp: 20 18 20 18   Temp: 98.2 F (36.8 C) 98.8 F (37.1 C) 98.7 F (37.1 C) 98.9 F (37.2 C)  TempSrc: Oral Oral Oral Oral  SpO2:  94% 99% 99%  Weight:      Height:       General: alert, cooperative and no distress Lochia: appropriate Uterine Fundus: firm Incision: Healing well with no significant drainage, Dressing is clean, dry, and intact DVT Evaluation: No evidence of DVT seen on physical  exam. No cords or calf tenderness. No significant calf/ankle edema.  Labs: Lab Results  Component Value Date   WBC 12.2 (H) 02/09/2019   HGB 11.8 (L) 02/09/2019   HCT 35.3 (L) 02/09/2019   MCV 82.5 02/09/2019   PLT 189 02/09/2019    Discharge instruction: per After Visit Summary.  Medications:  Allergies as of 02/09/2019   No Known Allergies     Medication List    TAKE these medications   DULoxetine 30 MG capsule Commonly known as: CYMBALTA   ferrous sulfate 325 (65 FE) MG tablet Take 325 mg by mouth daily with breakfast.   gabapentin 300 MG capsule Commonly known as: NEURONTIN   ibuprofen 600 MG tablet Commonly known as: ADVIL Take 1 tablet (600 mg total) by mouth every 6 (six) hours.   Latuda 80 MG Tabs tablet Generic drug: lurasidone Take 80 mg by mouth at bedtime.   prenatal vitamin w/FE, FA 27-1 MG Tabs tablet Take by mouth.   traZODone 50 MG tablet Commonly known as: [place]            Discharge Care Instructions  (From admission, onward)  Start     Ordered   02/09/19 0000  Discharge wound care:    Comments: Perform wound care instructions   02/09/19 1200          Diet: routine diet  Activity: Advance as tolerated. Pelvic rest for 6 weeks.   Outpatient follow up: Follow-up Information    Rod Can, CNM. Go in 2 week(s).   Specialty: Obstetrics Why: postpartum mood check appointment Contact information: [address] [place] [address] [phone]             Postpartum contraception: Tubal Ligation Rhogam Given postpartum: NA Rubella vaccine given postpartum: Immune Varicella vaccine given postpartum: Immune TDaP given antepartum or postpartum: given antepartum  Newborn Data: Live born female: Zayden Birth Weight: 3410 g, 7 pounds 8 ounces APGAR: 9, 9  Newborn Delivery   Birth date/time: 02/08/2019 06:51:00 Delivery type: Vaginal, Spontaneous       Baby Feeding: Breast and  Formula  Disposition:home with mother.  Patient evaluated by clinical social worker prior to discharge.   SIGNED:  Prentice Docker, MD 02/09/2019 12:00 PM

## 2019-02-08 NOTE — Transfer of Care (Signed)
Immediate Anesthesia Transfer of Care Note  Patient: Susan [name]  Procedure(s) Performed: POST PARTUM TUBAL LIGATION (Bilateral Abdomen)  Patient Location: PACU  Anesthesia Type:General  Level of Consciousness: sedated  Airway & Oxygen Therapy: Patient Spontanous Breathing and Patient connected to face mask oxygen  Post-op Assessment: Report given to RN and Post -op Vital signs reviewed and stable  Post vital signs: Reviewed and stable  Last Vitals:  Vitals Value Taken Time  BP 84/42 02/08/19 1727  Temp    Pulse 80 02/08/19 1729  Resp 27 02/08/19 1729  SpO2 99 % 02/08/19 1729  Vitals shown include unvalidated device data.  Last Pain:  Vitals:   02/08/19 1503  TempSrc: Tympanic  PainSc: 0-No pain      Patients Stated Pain Goal: (doesnt require or request pain medication) ([id number])  Complications: No apparent anesthesia complications

## 2019-02-08 NOTE — Progress Notes (Signed)
  Labor Progress Note   33 y.o. [id number] @ [redacted]w[redacted]d , admitted for  Pregnancy, Labor Management.   Subjective:  She hasn't noticed a difference in contractions since being on pitocin.   Objective:  BP 122/82   Pulse 87   Temp 98.4 F (36.9 C) (Oral)   Resp 16   Ht 5\' 7"  (1.702 m)   Wt 114.8 kg   BMI 39.63 kg/m  Abd: gravid, ND, FHT present, mild tenderness on exam Extr: no edema SVE: CERVIX: 3.5-4 cm dilated, 70 effaced, -2 station AROM clear, large amount  EFM: FHR: 120 bpm, variability: moderate,  accelerations:  Present,  decelerations:  Absent Toco: Frequency: Every 2-3 minutes Labs: I have reviewed the patient's lab results.   Assessment & Plan:  [id number] @ [redacted]w[redacted]d, admitted for  Pregnancy and Labor/Delivery Management  1. Pain management: none. 2. FWB: FHT category I.  3. ID: GBS negative 4. Labor management: reassess for cervical change, titrate pitocin as needed  All discussed with patient, see orders   Rod Can, [facility] 02/08/2019  5:54 AM

## 2019-02-08 NOTE — Anesthesia Procedure Notes (Signed)
Procedure Name: Intubation Date/Time: 02/08/2019 4:51 PM Performed by: Caryl Asp, CRNA Pre-anesthesia Checklist: Patient identified, Patient being monitored, Timeout performed, Emergency Drugs available and Suction available Patient Re-evaluated:Patient Re-evaluated prior to induction Oxygen Delivery Method: Circle system utilized Preoxygenation: Pre-oxygenation with 100% oxygen Induction Type: IV induction Ventilation: Mask ventilation without difficulty Laryngoscope Size: 3 and McGraph Grade View: Grade I Tube type: Oral Tube size: 6.5 mm Number of attempts: 1 Airway Equipment and Method: Stylet Placement Confirmation: ETT inserted through vocal cords under direct vision,  positive ETCO2 and breath sounds checked- equal and bilateral Secured at: 21 cm Tube secured with: Tape Dental Injury: Teeth and Oropharynx as per pre-operative assessment

## 2019-02-08 NOTE — Progress Notes (Signed)
CSW aware of consult regarding custody matters. CSW called into MOB's room but is aware that MOB is in OR at this time per note. CSW to follow up with MOB on tomorrow to assess for further needs and gather more information on custody matters regarding children.     Susan [name], MSW, LCSW [facility] [place] [facility] [place] [phone]

## 2019-02-08 NOTE — Anesthesia Post-op Follow-up Note (Signed)
Anesthesia QCDR form completed.        

## 2019-02-08 NOTE — Progress Notes (Signed)
Pt doing well after delivery Desires permanent sterilization    Has planned and signed for PP BTL Consent obtained after full discussion The patient has been fully informed about all methods of contraception, both temporary and permanent. She understands that tubal ligation is meant to be permanent, absolute and irreversible. She was told that there is an approximately 1 in 400 chance of a pregnancy in the future after tubal ligation. She was told the short and long term complications of tubal ligation. She understands the risks from this surgery include, but are not limited to, the risks of anesthesia, hemorrhage, infection, perforation, and injury to adjacent structures, bowel, bladder and blood vessels.   Barnett Applebaum, MD, [facility], [facility] 02/08/2019  7:52 AM

## 2019-02-08 NOTE — Lactation Note (Signed)
This note was copied from a baby's chart. Lactation Consultation Note  Patient Name: Susan [name] [id number] Date: 02/08/2019 Reason for consult: Initial assessment   Lactation visit and introduction made. Mother verbalizes desire to do both breast and bottle. Shares that this will be her first baby that she is attempting to breastfeed because it was too painful in the past with her prior deliveries.   Discussed the basics of breastfeeding during the 1st 24hrs of baby's life. Encouraged mother to always place baby to breast before offering the bottle. Educated mother and support person that baby may be sleepy during this first day of life, and may require encouragement to achieve goal of 8-12 feeds in 1st day of life. Made mother aware that baby may start to cluster feed as they get closer to 24hr mark. Advised mother that breastfeeding should not be painful with good latch and encouraged utilizing support in hospital if this arises again.   Encouraged mother to call with next feed for lactation to observe. Explained how to reach [facility] while inpatient. No further questions or concerns at this time.   Maternal Data Formula Feeding for Exclusion: Yes Reason for exclusion: Mother's choice to formula and breast feed on admission  Feeding    LATCH Score                   Interventions Interventions: Breast feeding basics reviewed;Position options  Lactation Tools Discussed/Used     Consult Status Consult Status: Follow-up Date: 02/08/19 Follow-up type: In-patient   [facility] Lactation Student  Lavonia Drafts 02/08/2019, 11:44 AM

## 2019-02-08 NOTE — Op Note (Signed)
Operative Note  02/08/2019  PRE-OP DIAGNOSIS: Desire for sterility  POST-OP DIAGNOSIS: same  SURGEON: Barnett Applebaum, MD, FACOG  PROCEDURE: Postpartum Bilateral Tubal Ligation Procedure   ANESTHESIA: General   ESTIMATED BLOOD LOSS: <10 mL  SPECIMENS: Portion of left and right tube  COMPLICATIONS: None  DISPOSITION: PACU - hemodynamically stable.  CONDITION: stable  FINDINGS: Exam under anesthesia revealed small, mobile  uterus with no masses and bilateral adnexa without masses or fullness.   TECHNIQUE:  Patient is prepped and draped in usual sterile fashion after adequate anesthesia is obtained in the supine position on the operating room table.  Local anesthesia is injected into the skin just inferior to the umbilicus, followed by a small elliptical incision with a scapel.  Fascia is identified and tented upwards, and an incision is made with Mayo scissors.  Identification of no adherent bowel is made. Retractors are placed and trendelenburg positioning is achieved.    The left Fallopian tube was identified, grasped with the Babcock clamps, lifted to the skin incision and followed out distally to the fimbriae. An avascular midsection of the tube approximately 3-4cm from the cornua was grasped with the babcock clamps and brought into a knuckle at the skin incision. The tube was double ligated with 2-0 Vicryl suture and the intervening portion of tube was transected and removed. Excellent hemostasis was noted and the tube was returned to the abdomen. Attention was then turned to the right fallopian tube after confirmation of identification by tracing the tube out to the fimbriae. The same procedure was then performed on the right Fallopian tube. Again, excellent hemostasis was noted at the end of the procedure.  Retractors are removed and fascia closed with a 2-0 Vicryl suture. Irrigation and hemostasis confirmed.  Skin closed with a 4-0 vicryl suture in a subcuticular fashion followed by  skin adhesive.  Pt goes to recovery room in stable fashion.  All counts correct times 2.    Barnett Applebaum, MD, [facility], [facility] 02/08/2019  5:22 PM

## 2019-02-09 LAB — CBC
HCT: 35.3 % — ABNORMAL LOW (ref 36.0–46.0)
Hemoglobin: 11.8 g/dL — ABNORMAL LOW (ref 12.0–15.0)
MCH: 27.6 pg (ref 26.0–34.0)
MCHC: 33.4 g/dL (ref 30.0–36.0)
MCV: 82.5 fL (ref 80.0–100.0)
Platelets: 189 10*3/uL (ref 150–400)
RBC: 4.28 MIL/uL (ref 3.87–5.11)
RDW: 13.7 % (ref 11.5–15.5)
WBC: 12.2 10*3/uL — ABNORMAL HIGH (ref 4.0–10.5)
nRBC: 0 % (ref 0.0–0.2)

## 2019-02-09 MED ORDER — IBUPROFEN 600 MG PO TABS: 600.0000 mg | ORAL_TABLET | Freq: Four times a day (QID) | ORAL | 0 refills | Status: DC

## 2019-02-09 NOTE — Progress Notes (Signed)
Patient discharged home with infant. Discharge instructions, prescriptions and follow up appointment given to and reviewed with patient. Patient verbalized understanding. Pt wheeled out with infant by NT

## 2019-02-09 NOTE — Lactation Note (Signed)
This note was copied from a baby's chart. Lactation Consultation Note  Patient Name: Susan [name] [id number] Date: 02/09/2019 Reason for consult: Follow-up assessment  LC student entered for follow up consult. Baby was getting heel pricks for testing. Mom reported that breastfeeding was going well and that baby is great at latching. Mom was giving formula overnight, but decided to feed at the breast more often as baby was spitting up a lot after formula. Mom was encouraged to stimulate breasts regularly even when baby is receiving a formula feed. Mom was educated to pump at least 8 times a day to establish and maintain milk supply if pumping is her preference, and to keep up pumping overnight. Mom was educated about breast milk storage, and using a pump while away from home.   Mom was educated about output expectations, stomach size, cluster feeding, and using lactation services and support groups after going home.       Maternal Data Formula Feeding for Exclusion: No Does the patient have breastfeeding experience prior to this delivery?: No  Feeding    LATCH Score                   Interventions Interventions: Breast feeding basics reviewed;Skin to skin;DEBP  Lactation Tools Discussed/Used     Consult Status Consult Status: Complete Date: 02/09/19 Follow-up type: Call as needed    Lavonia Drafts 02/09/2019, 9:58 AM
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
## 2019-02-09 NOTE — Progress Notes (Signed)
CSW followed back up with [place] regarding contact for [employer]	[place]. CSW was made aware that Pam is out of the office at this time therefore CSW's information has been forwarded to [employer] supervisor at this time. CSW awaiting call back from supervisor at this time.      Virgie Dad Luna Audia, MSW, LCSW Women[facility] and [place] at [place] [phone]

## 2019-02-09 NOTE — Progress Notes (Signed)
CSW spoke with intake from [facility] CPS. CSW was transferred to an individual with the name of Joline Maxcy. CSW left message asking that Levada Dy call CSW back regarding discharge for MOB. CSW unable to speak with any from [place] as no one has returned calls. CSW aware that Mob ready to discharge. CSW updated staff that CSW from hospital has no concerns with MOB discharging with infant at this time. CSW has left messages for all CPS workers and will update them if and when they call CSW back regarding  Mob discharging with infant.      At this time there, are no CSW needs.  CSW will sign off.       Virgie Dad. Arth Nicastro, MSW, LCSW [facility] and [place] at [place] [phone]

## 2019-02-10 LAB — SURGICAL PATHOLOGY

## 2019-02-10 NOTE — Anesthesia Postprocedure Evaluation (Signed)
Anesthesia Post Note  Patient: Susan [name]  Procedure(s) Performed: POST PARTUM TUBAL LIGATION (Bilateral Abdomen)  Patient location during evaluation: PACU Anesthesia Type: General Level of consciousness: awake and alert and oriented Pain management: pain level controlled Vital Signs Assessment: post-procedure vital signs reviewed and stable Respiratory status: spontaneous breathing Cardiovascular status: blood pressure returned to baseline Anesthetic complications: no     Last Vitals:  Vitals:   02/09/19 0833 02/09/19 1203  BP: (!) 128/92 127/87  Pulse: 73 84  Resp: 18 20  Temp: 37.2 C 36.8 C  SpO2: 99%     Last Pain:  Vitals:   02/09/19 1400  TempSrc:   PainSc: 0-No pain                 Noris Kulinski

## 2019-02-14 ENCOUNTER — Inpatient Hospital Stay: Admit: 2019-02-14 | Payer: Self-pay

## 2019-02-21 ENCOUNTER — Ambulatory Visit: Payer: Medicare Other | Attending: Obstetrics and Gynecology

## 2019-03-01 ENCOUNTER — Encounter: Payer: Self-pay | Admitting: Advanced Practice Midwife

## 2019-03-01 ENCOUNTER — Other Ambulatory Visit: Payer: Self-pay

## 2019-03-01 ENCOUNTER — Ambulatory Visit (INDEPENDENT_AMBULATORY_CARE_PROVIDER_SITE_OTHER): Payer: Medicare Other | Admitting: Advanced Practice Midwife

## 2019-03-01 NOTE — Progress Notes (Signed)
3 week postpartum mood check  S: The patient reports she is doing very well. She is on the correct dose of medication and she is seeing a therapist regularly. She is not breastfeeding due to taking adderall. She has no mood concerns today. She has a question about the incision site from tubal procedure. There is a thickened area that is numb. She reports baby is doing well  Review of Systems  Constitutional: Negative.   HENT: Negative.   Eyes: Negative.   Respiratory: Negative.   Cardiovascular: Negative.   Gastrointestinal:       Numb area at incision site  Genitourinary: Negative.   Musculoskeletal: Negative.   Skin: Negative.   Neurological: Negative.   Endo/Heme/Allergies: Negative.   Psychiatric/Behavioral: Negative.     O: Vital Signs: Ht 5\' 7"  (1.702 m)   Wt 242 lb (109.8 kg)   BMI 37.90 kg/m  Constitutional: Well nourished, well developed female in no acute distress.  HEENT: normal Skin: Warm and dry.  Cardiovascular: Regular rate and rhythm.   Extremity: no edema  Respiratory: Clear to auscultation bilateral. Normal respiratory effort Abdomen: incision site at [place] is healed, no drainage or signs of infection, appears to be keloid type tissue, no pain or tenderness Neuro: DTRs 2+, Cranial nerves grossly intact Psych: Alert and Oriented x3. No memory deficits. Normal mood and affect.  MS: normal gait, normal bilateral lower extremity ROM/strength/stability.  EPDS score is 2  A: 34 yo G6 [id number] 3 weeks postpartum, no mood concerns, progressing well postpartum  P: Continue medications and therapy Return to clinic for 6 week postpartum visit   Rod Can, [facility] 03/01/2019, 4:33 PM

## 2019-06-27 ENCOUNTER — Emergency Department
Admission: EM | Admit: 2019-06-27 | Discharge: 2019-06-28 | Disposition: A | Discharge status: Other Acute Inpatient Hospital | Payer: Medicare Other | Attending: Emergency Medicine | Admitting: Emergency Medicine

## 2019-06-27 ENCOUNTER — Encounter: Payer: Self-pay | Admitting: Medical Oncology

## 2019-06-27 ENCOUNTER — Emergency Department: Payer: Medicare Other

## 2019-06-27 ENCOUNTER — Other Ambulatory Visit: Payer: Self-pay

## 2019-06-27 DIAGNOSIS — Z23 Encounter for immunization: Secondary | ICD-10-CM | POA: Insufficient documentation

## 2019-06-27 DIAGNOSIS — X788XXA Intentional self-harm by other sharp object, initial encounter: Secondary | ICD-10-CM | POA: Diagnosis not present

## 2019-06-27 DIAGNOSIS — F319 Bipolar disorder, unspecified: Secondary | ICD-10-CM | POA: Insufficient documentation

## 2019-06-27 DIAGNOSIS — T148XXA Other injury of unspecified body region, initial encounter: Secondary | ICD-10-CM

## 2019-06-27 DIAGNOSIS — Y999 Unspecified external cause status: Secondary | ICD-10-CM | POA: Insufficient documentation

## 2019-06-27 DIAGNOSIS — F251 Schizoaffective disorder, depressive type: Secondary | ICD-10-CM | POA: Insufficient documentation

## 2019-06-27 DIAGNOSIS — Z20822 Contact with and (suspected) exposure to covid-19: Secondary | ICD-10-CM | POA: Diagnosis not present

## 2019-06-27 DIAGNOSIS — T1491XA Suicide attempt, initial encounter: Secondary | ICD-10-CM | POA: Diagnosis present

## 2019-06-27 DIAGNOSIS — S71131A Puncture wound without foreign body, right thigh, initial encounter: Secondary | ICD-10-CM | POA: Insufficient documentation

## 2019-06-27 DIAGNOSIS — Y929 Unspecified place or not applicable: Secondary | ICD-10-CM | POA: Insufficient documentation

## 2019-06-27 DIAGNOSIS — Y9389 Activity, other specified: Secondary | ICD-10-CM | POA: Insufficient documentation

## 2019-06-27 DIAGNOSIS — R45851 Suicidal ideations: Secondary | ICD-10-CM | POA: Diagnosis not present

## 2019-06-27 DIAGNOSIS — R4182 Altered mental status, unspecified: Secondary | ICD-10-CM | POA: Diagnosis present

## 2019-06-27 LAB — COMPREHENSIVE METABOLIC PANEL
ALT: 14 U/L (ref 0–44)
AST: 17 U/L (ref 15–41)
Albumin: 4.1 g/dL (ref 3.5–5.0)
Alkaline Phosphatase: 50 U/L (ref 38–126)
Anion gap: 5 (ref 5–15)
BUN: 11 mg/dL (ref 6–20)
CO2: 28 mmol/L (ref 22–32)
Calcium: 9 mg/dL (ref 8.9–10.3)
Chloride: 106 mmol/L (ref 98–111)
Creatinine, Ser: 0.88 mg/dL (ref 0.44–1.00)
GFR calc Af Amer: >60 mL/min (ref >60)
GFR calc non Af Amer: >60 mL/min (ref >60)
Glucose, Bld: 94 mg/dL (ref 70–99)
Potassium: 3.8 mmol/L (ref 3.5–5.1)
Sodium: 139 mmol/L (ref 135–145)
Total Bilirubin: 0.9 mg/dL (ref 0.3–1.2)
Total Protein: 7 g/dL (ref 6.5–8.1)

## 2019-06-27 LAB — URINE DRUG SCREEN, QUALITATIVE (ARMC ONLY)
Amphetamines, Ur Screen: POSITIVE — AB
Barbiturates, Ur Screen: NOT DETECTED
Benzodiazepine, Ur Scrn: NOT DETECTED
Cannabinoid 50 Ng, Ur ~~LOC~~: POSITIVE — AB
Cocaine Metabolite,Ur ~~LOC~~: NOT DETECTED
MDMA (Ecstasy)Ur Screen: NOT DETECTED
Methadone Scn, Ur: NOT DETECTED
Opiate, Ur Screen: NOT DETECTED
Phencyclidine (PCP) Ur S: NOT DETECTED
Tricyclic, Ur Screen: NOT DETECTED

## 2019-06-27 LAB — SARS CORONAVIRUS 2 BY RT PCR (HOSPITAL ORDER, PERFORMED IN ~~LOC~~ HOSPITAL LAB): SARS Coronavirus 2: NEGATIVE

## 2019-06-27 LAB — CBC
HCT: 34.5 % — ABNORMAL LOW (ref 36.0–46.0)
Hemoglobin: 11.1 g/dL — ABNORMAL LOW (ref 12.0–15.0)
MCH: 28.7 pg (ref 26.0–34.0)
MCHC: 32.2 g/dL (ref 30.0–36.0)
MCV: 89.1 fL (ref 80.0–100.0)
Platelets: 203 10*3/uL (ref 150–400)
RBC: 3.87 MIL/uL (ref 3.87–5.11)
RDW: 12.9 % (ref 11.5–15.5)
WBC: 5.6 10*3/uL (ref 4.0–10.5)
nRBC: 0 % (ref 0.0–0.2)

## 2019-06-27 LAB — SALICYLATE LEVEL: Salicylate Lvl: <7 mg/dL — ABNORMAL LOW (ref 7.0–30.0)

## 2019-06-27 LAB — ETHANOL: Alcohol, Ethyl (B): <10 mg/dL (ref <10)

## 2019-06-27 LAB — ACETAMINOPHEN LEVEL: Acetaminophen (Tylenol), Serum: <10 ug/mL — ABNORMAL LOW (ref 10–30)

## 2019-06-27 LAB — POCT PREGNANCY, URINE: Preg Test, Ur: NEGATIVE

## 2019-06-27 MED ORDER — LORAZEPAM 1 MG PO TABS
1.0000 mg | ORAL_TABLET | Freq: Once | ORAL | Status: AC
  Administered 2019-06-28: 1 mg via ORAL
  Filled 2019-06-27: qty 1

## 2019-06-27 MED ORDER — DULOXETINE HCL 60 MG PO CPEP
60.0000 mg | ORAL_CAPSULE | Freq: Every day | ORAL | Status: DC
  Administered 2019-06-28: 60 mg via ORAL
  Filled 2019-06-27: qty 1

## 2019-06-27 MED ORDER — CARIPRAZINE HCL 3 MG PO CAPS
3.0000 mg | ORAL_CAPSULE | Freq: Every day | ORAL | Status: DC
  Administered 2019-06-28: 3 mg via ORAL
  Filled 2019-06-27: qty 1

## 2019-06-27 MED ORDER — TETANUS-DIPHTH-ACELL PERTUSSIS 5-2.5-18.5 LF-MCG/0.5 IM SUSP
0.5000 mL | Freq: Once | INTRAMUSCULAR | Status: AC
  Administered 2019-06-27: 0.5 mL via INTRAMUSCULAR
  Filled 2019-06-27: qty 0.5

## 2019-06-27 MED ORDER — TRAZODONE HCL 100 MG PO TABS
100.0000 mg | ORAL_TABLET | Freq: Every day | ORAL | Status: DC
  Administered 2019-06-28: 100 mg via ORAL
  Filled 2019-06-27: qty 1

## 2019-06-27 MED ORDER — FERROUS SULFATE 325 (65 FE) MG PO TABS
325.0000 mg | ORAL_TABLET | Freq: Every day | ORAL | Status: DC
  Administered 2019-06-28: 325 mg via ORAL
  Filled 2019-06-27: qty 1

## 2019-06-27 MED ORDER — LORAZEPAM 1 MG PO TABS
1.0000 mg | ORAL_TABLET | Freq: Once | ORAL | Status: AC
  Administered 2019-06-27: 1 mg via ORAL
  Filled 2019-06-27: qty 1

## 2019-06-27 NOTE — BH Assessment (Signed)
Referral information for Psychiatric Hospitalization faxed to;   [facility] [phone]),   . [facility] [phone]),   . [facility] [phone] -or- [phone]),   . [facility] [phone])   [facility] [phone])

## 2019-06-27 NOTE — ED Notes (Signed)
Pt. To BHU from ED ambulatory without difficulty, to room  BHU2. Report from [facility]. Pt. Is alert and oriented, warm and dry in no distress. Pt. Denies SI, HI, and AVH. Pt. Calm and cooperative. Pt. Made aware of security cameras and Q15 minute rounds. Patient tearful. Pt. Encouraged to let Nursing staff know of any concerns or needs.
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
## 2019-06-27 NOTE — ED Notes (Signed)
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
## 2019-06-27 NOTE — ED Notes (Signed)
Pt dressed out by Mac RN and Leah NT. Belongings included pink bra, black tshirt, pink underwear, blue denim slip-on flats, broken blue hair tie, and one pair gold hoop earrings.
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
## 2019-06-27 NOTE — BH Assessment (Signed)
PATIENT BED AVAILABLE AFTER 9AM ON 06/28/19  Patient has been accepted to [facility].  Patient assigned to [place] Accepting physician is Dr. Reece Levy.  Call report to [phone].  Representative was [place].   ER Staff is aware of it:  [facility] ER Secretary  Dr.Sung , ER MD  Maudie Mercury Patient's Nurse     Address: [place] [id number] Patient must check-in at the [place] for Temperature check

## 2019-06-27 NOTE — ED Notes (Signed)
[facility] called wanting info for possible admission.

## 2019-06-28 DIAGNOSIS — F251 Schizoaffective disorder, depressive type: Secondary | ICD-10-CM | POA: Diagnosis not present

## 2019-06-28 DIAGNOSIS — F319 Bipolar disorder, unspecified: Secondary | ICD-10-CM | POA: Diagnosis present

## 2019-06-28 NOTE — ED Notes (Signed)
Patient in restroom.

## 2019-06-28 NOTE — ED Notes (Signed)
[facility]  DEPT  CALLED  FOR  TRANSPORT TO  [facility]  INFORMED  AMY  TEAGUE  RN

## 2019-06-28 NOTE — ED Provider Notes (Signed)
Emergency Medicine Observation Re-evaluation Note  Susan [name] is a 34 y.o. female, seen on rounds today.  Pt initially presented to the ED for complaints of Psychiatric Evaluation Currently, the patient is resting in no acute distress.  Physical Exam  BP 123/77 (BP Location: Left Arm)   Pulse 70   Temp 98.2 F (36.8 C) (Oral)   Resp 20   Ht 5\' 7"  (1.702 m)   Wt 109 kg   LMP 06/27/2019 Comment: tubal ligation  SpO2 97%   BMI 37.64 kg/m  Physical Exam  ED Course / MDM  EKG:    I have reviewed the labs performed to date as well as medications administered while in observation.  Recent changes in the last 24 hours include no events overnight. Plan  Current plan is for [facility] this morning. Patient is under full IVC at this time.   Susan Blanch, MD 06/28/19 [phone]
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
## 2019-06-28 NOTE — ED Notes (Signed)
IVC/Accepted to [facility] after 9 Am on 06/28/19

## 2020-02-17 ENCOUNTER — Other Ambulatory Visit: Payer: Self-pay

## 2020-02-17 ENCOUNTER — Other Ambulatory Visit: Payer: Medicare Other

## 2020-02-17 ENCOUNTER — Encounter: Payer: Self-pay | Admitting: Medical Oncology

## 2020-02-17 DIAGNOSIS — Z20822 Contact with and (suspected) exposure to covid-19: Secondary | ICD-10-CM

## 2020-02-18 ENCOUNTER — Other Ambulatory Visit: Payer: Medicare Other

## 2020-02-20 LAB — NOVEL CORONAVIRUS, NAA: SARS-CoV-2, NAA: DETECTED — AB

## 2020-02-25 ENCOUNTER — Other Ambulatory Visit: Payer: Medicare Other

## 2020-05-08 IMAGING — US US RENAL
1 series · 14 of 25 positions shown · non-contrast
Comparison: CT Abdomen and Pelvis 10/30/2010

CLINICAL DATA: 32-year-old female is pregnant in the 1st trimester.
Gross hematuria.

EXAM:
RENAL / URINARY TRACT ULTRASOUND COMPLETE

[Series 1: us renal · 37 acquisitions, 14 frames shown]
[im 1/37]
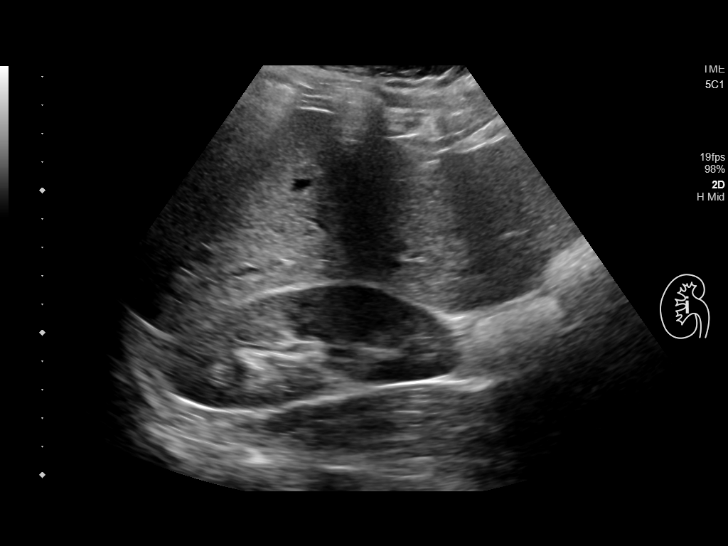
[im 4/37]
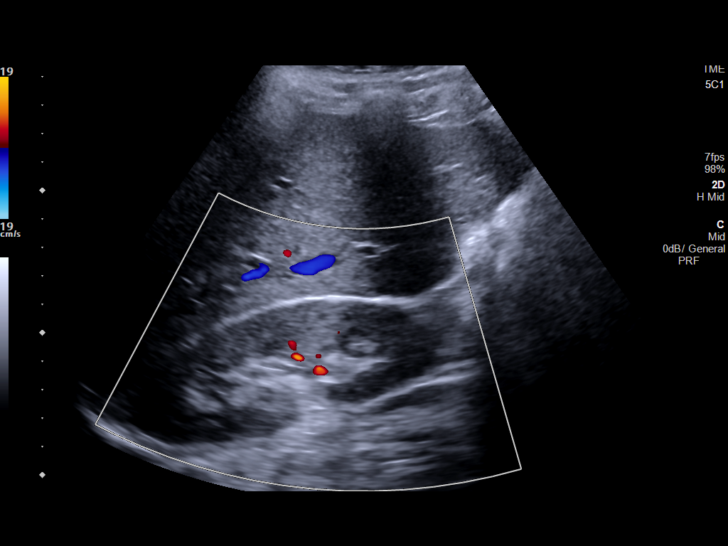
[im 7/37]
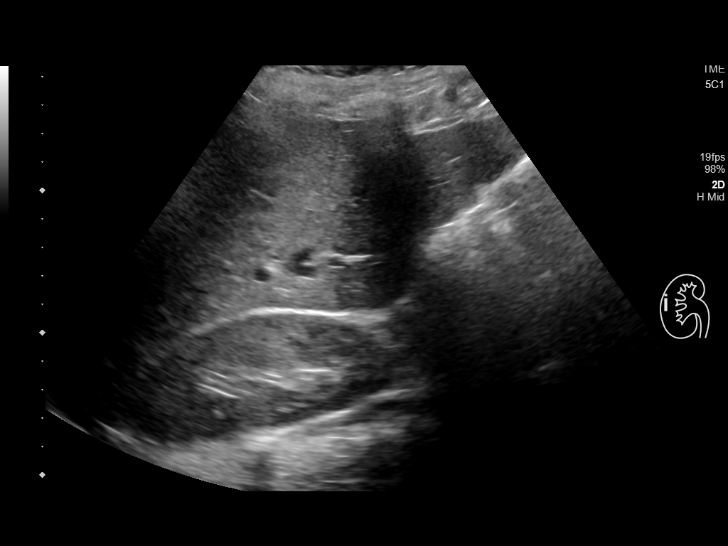
[im 10/37]
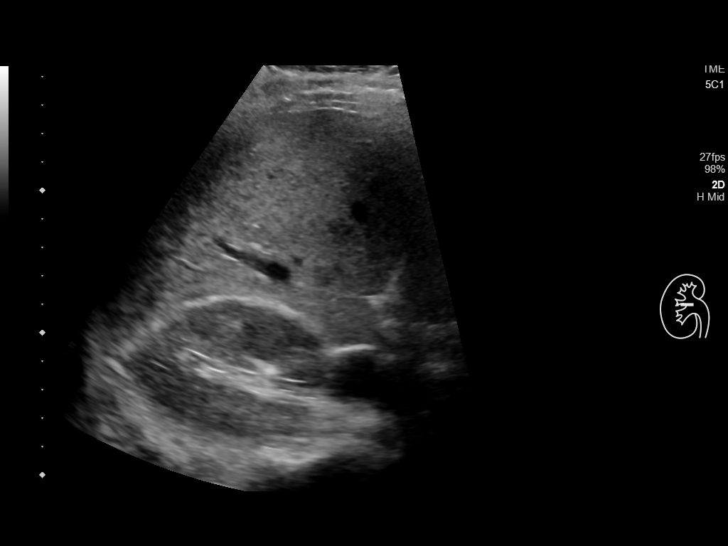
[im 13/37]
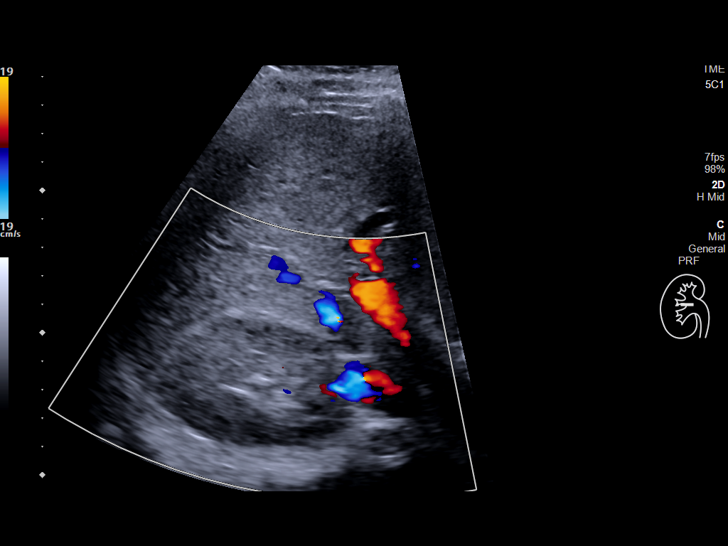
[im 14/37]
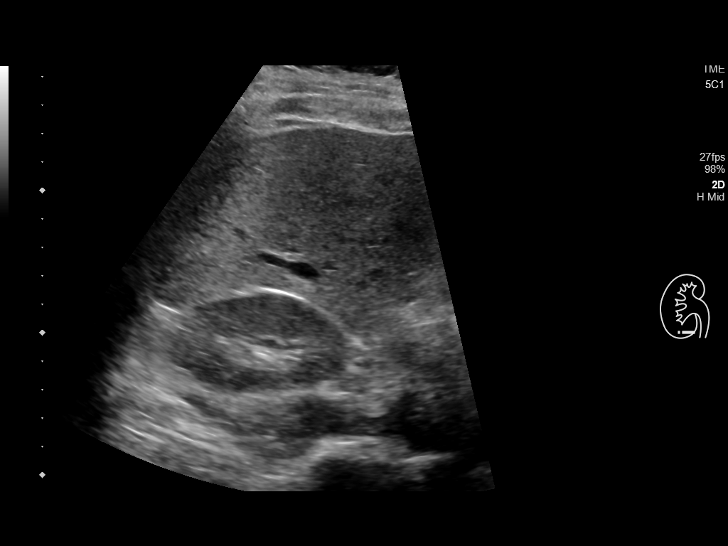
[im 17/37]
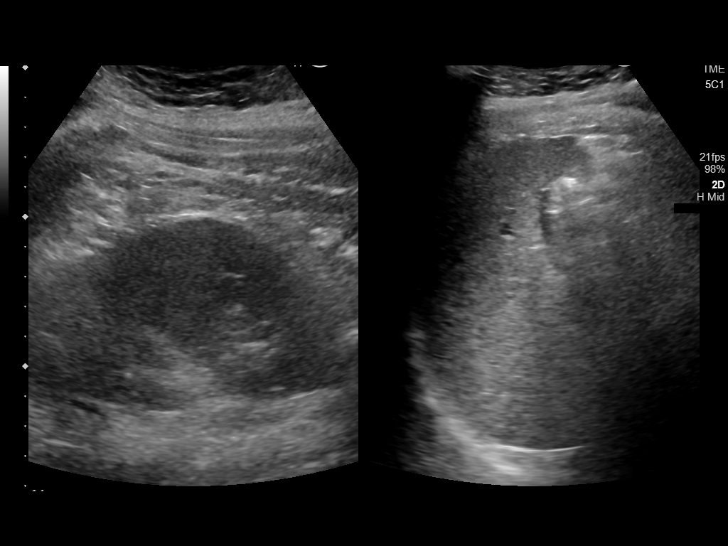
[im 20/37]
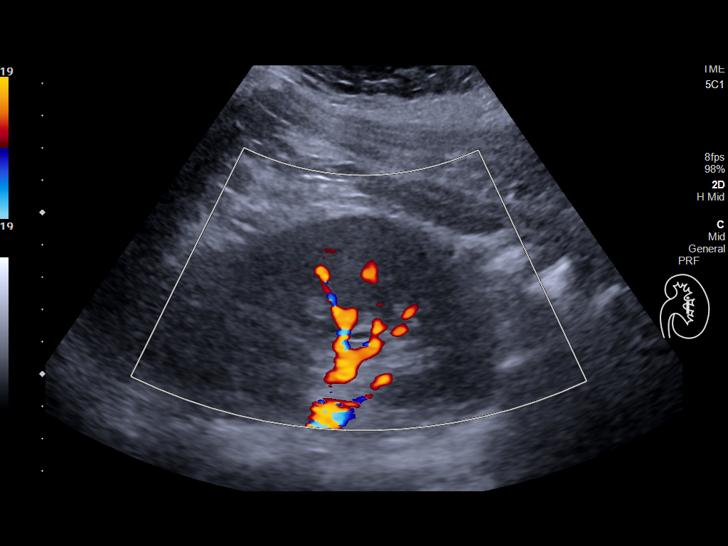
[im 23/37]
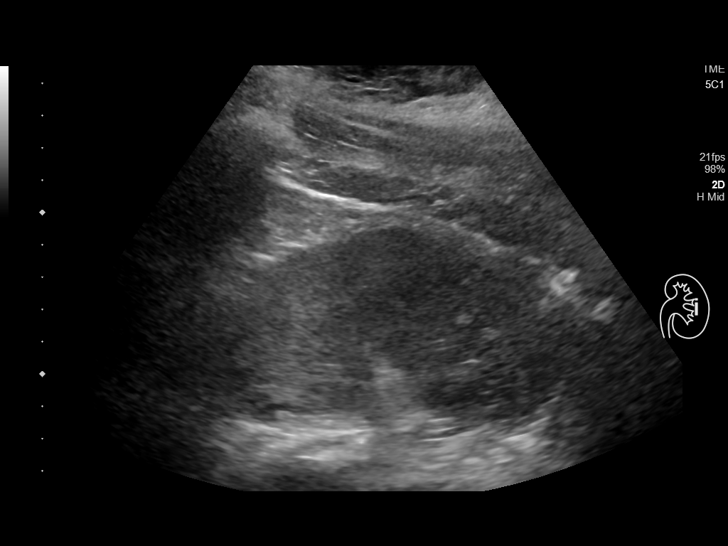
[im 25/37]
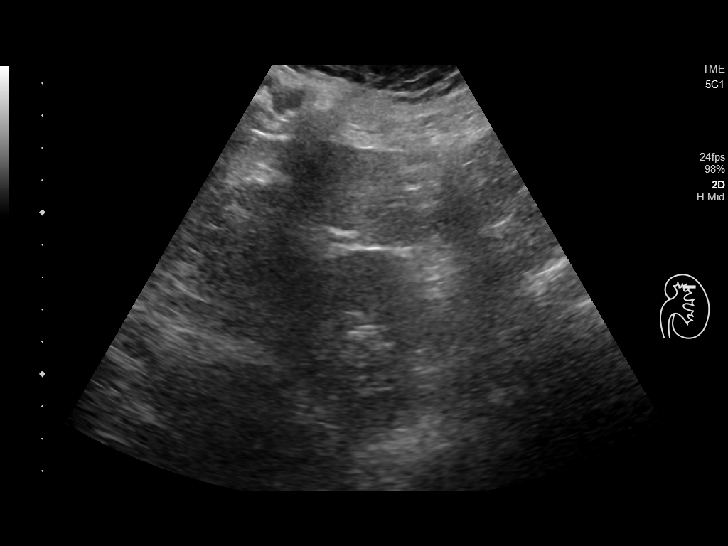
[im 28/37]
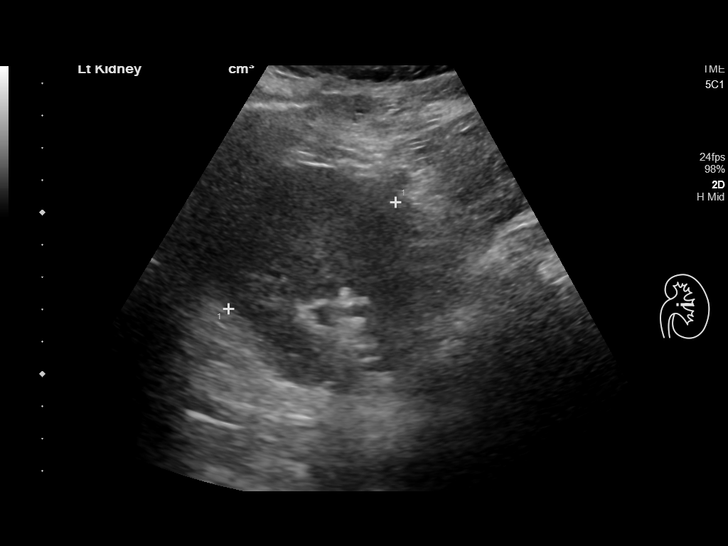
[im 31/37]
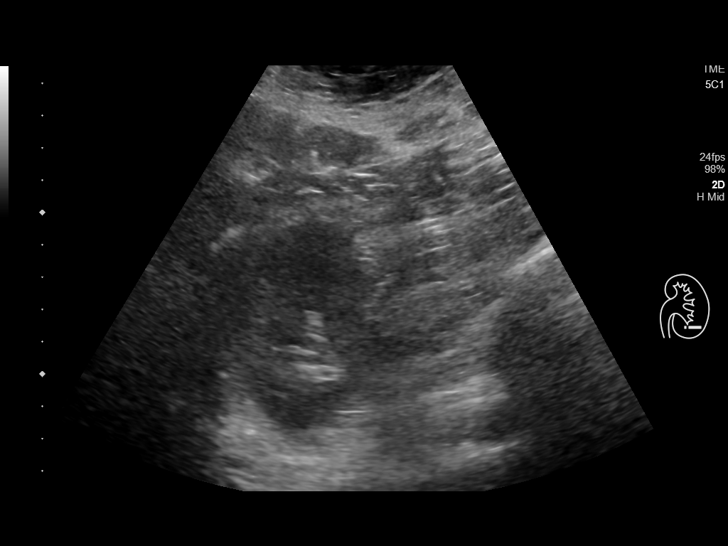
[im 34/37]
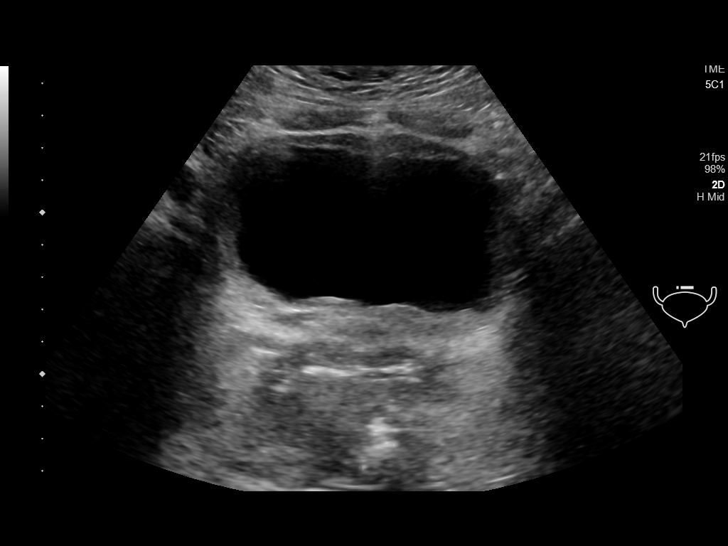
[im 37/37]
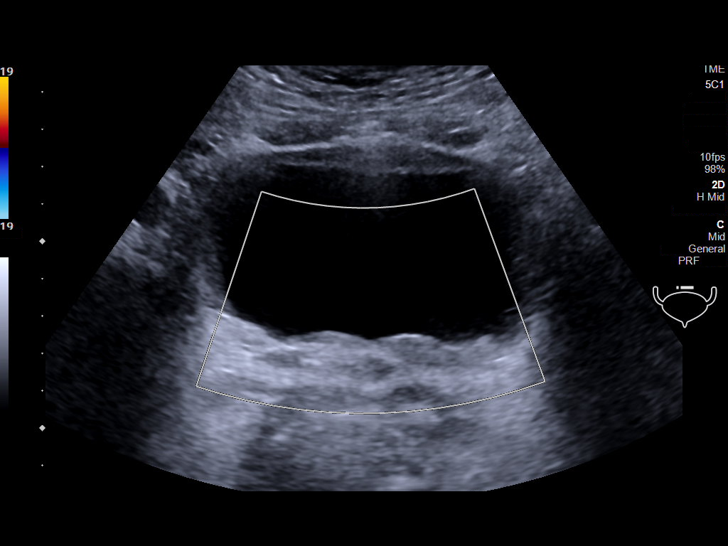

[14 of 25 positions shown; findings below may reference images not displayed]

FINDINGS: Right Kidney:

Renal measurements: 10.4 x 6.9 x 4.7 centimeters = volume: 178 mL .
Echogenicity within normal limits. No mass or hydronephrosis
visualized.

Left Kidney:

Renal measurements: 11.0 x 6.7 x 6.1 centimeters = volume: 236 mL.
Echogenicity within normal limits. No mass or hydronephrosis
visualized.

Bladder:

Appears normal for degree of bladder distention. No urinary debris
identified. The right ureteral jet was detected with Doppler, the
left was not identified during imaging.
IMPRESSION: Normal ultrasound appearance of both kidneys and the urinary
bladder.

## 2020-12-06 ENCOUNTER — Emergency Department: Payer: Medicare Other

## 2020-12-06 ENCOUNTER — Ambulatory Visit
Admission: EM | Admit: 2020-12-06 | Discharge: 2020-12-06 | Disposition: A | Discharge status: Home / Self Care | Payer: No Typology Code available for payment source | Attending: Emergency Medicine | Admitting: Emergency Medicine

## 2020-12-06 ENCOUNTER — Other Ambulatory Visit: Payer: Self-pay

## 2020-12-06 ENCOUNTER — Emergency Department
Admission: EM | Admit: 2020-12-06 | Discharge: 2020-12-06 | Disposition: A | Discharge status: Home / Self Care | Payer: Medicare Other | Attending: Emergency Medicine | Admitting: Emergency Medicine

## 2020-12-06 ENCOUNTER — Encounter: Payer: Self-pay | Admitting: Emergency Medicine

## 2020-12-06 DIAGNOSIS — Z0441 Encounter for examination and observation following alleged adult rape: Secondary | ICD-10-CM | POA: Diagnosis present

## 2020-12-06 DIAGNOSIS — R4182 Altered mental status, unspecified: Secondary | ICD-10-CM | POA: Diagnosis not present

## 2020-12-06 DIAGNOSIS — R102 Pelvic and perineal pain: Secondary | ICD-10-CM | POA: Insufficient documentation

## 2020-12-06 DIAGNOSIS — Z87891 Personal history of nicotine dependence: Secondary | ICD-10-CM | POA: Insufficient documentation

## 2020-12-06 DIAGNOSIS — T7421XA Adult sexual abuse, confirmed, initial encounter: Secondary | ICD-10-CM

## 2020-12-06 LAB — CBC WITH DIFFERENTIAL/PLATELET
Abs Immature Granulocytes: 0.03 10*3/uL (ref 0.00–0.07)
Basophils Absolute: 0 10*3/uL (ref 0.0–0.1)
Basophils Relative: 0 %
Eosinophils Absolute: 0.1 10*3/uL (ref 0.0–0.5)
Eosinophils Relative: 1 %
HCT: 31.8 % — ABNORMAL LOW (ref 36.0–46.0)
Hemoglobin: 10.3 g/dL — ABNORMAL LOW (ref 12.0–15.0)
Immature Granulocytes: 0 %
Lymphocytes Relative: 20 %
Lymphs Abs: 1.9 10*3/uL (ref 0.7–4.0)
MCH: 24.2 pg — ABNORMAL LOW (ref 26.0–34.0)
MCHC: 32.4 g/dL (ref 30.0–36.0)
MCV: 74.8 fL — ABNORMAL LOW (ref 80.0–100.0)
Monocytes Absolute: 0.6 10*3/uL (ref 0.1–1.0)
Monocytes Relative: 7 %
Neutro Abs: 7 10*3/uL (ref 1.7–7.7)
Neutrophils Relative %: 72 %
Platelets: 274 10*3/uL (ref 150–400)
RBC: 4.25 MIL/uL (ref 3.87–5.11)
RDW: 17.1 % — ABNORMAL HIGH (ref 11.5–15.5)
WBC: 9.7 10*3/uL (ref 4.0–10.5)
nRBC: 0 % (ref 0.0–0.2)

## 2020-12-06 LAB — COMPREHENSIVE METABOLIC PANEL
ALT: 16 U/L (ref 0–44)
AST: 26 U/L (ref 15–41)
Albumin: 4.1 g/dL (ref 3.5–5.0)
Alkaline Phosphatase: 56 U/L (ref 38–126)
Anion gap: 5 (ref 5–15)
BUN: 17 mg/dL (ref 6–20)
CO2: 23 mmol/L (ref 22–32)
Calcium: 8.9 mg/dL (ref 8.9–10.3)
Chloride: 109 mmol/L (ref 98–111)
Creatinine, Ser: 0.69 mg/dL (ref 0.44–1.00)
GFR, Estimated: >60 mL/min (ref >60)
Glucose, Bld: 92 mg/dL (ref 70–99)
Potassium: 3.8 mmol/L (ref 3.5–5.1)
Sodium: 137 mmol/L (ref 135–145)
Total Bilirubin: 0.5 mg/dL (ref 0.3–1.2)
Total Protein: 7.7 g/dL (ref 6.5–8.1)

## 2020-12-06 LAB — SALICYLATE LEVEL: Salicylate Lvl: <7 mg/dL — ABNORMAL LOW (ref 7.0–30.0)

## 2020-12-06 LAB — ACETAMINOPHEN LEVEL: Acetaminophen (Tylenol), Serum: <10 ug/mL — ABNORMAL LOW (ref 10–30)

## 2020-12-06 MED ORDER — CEFTRIAXONE SODIUM 1 G IJ SOLR
500.0000 mg | Freq: Once | INTRAMUSCULAR | Status: AC
  Administered 2020-12-06: 500 mg via INTRAMUSCULAR
  Filled 2020-12-06: qty 10

## 2020-12-06 MED ORDER — LIDOCAINE HCL (PF) 1 % IJ SOLN
1.0000 mL | Freq: Once | INTRAMUSCULAR | Status: AC
  Administered 2020-12-06: 1 mL
  Filled 2020-12-06: qty 5

## 2020-12-06 MED ORDER — METRONIDAZOLE 500 MG PO TABS
2000.0000 mg | ORAL_TABLET | Freq: Once | ORAL | Status: AC
  Administered 2020-12-06: 2000 mg via ORAL
  Filled 2020-12-06: qty 4
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
## 2020-12-06 NOTE — SANE Note (Signed)
Patient would not allow an additional blood draw for toxicology testing.  Several urine tests have been ordered, but patient has not urinated at this time.  Results of test requiring urine are not included on the Forensic Nursing Examination note as they have not been completed yet.

## 2020-12-06 NOTE — ED Notes (Signed)
Called requesting work note, informed would write was seen in ED today and have for pick-up
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
## 2020-12-06 NOTE — SANE Note (Signed)
Upon entering Family Waiting Room, FNE observed patient lying on couch.  Officer Teacher, music with [employer] and [employer] of [place] were also in the room with the patient.  FNE asked patient if she wanted Officer Bernerd Pho and Ms. Pefley to remain and she shook her head affirmatively.    FNE then asked patient if she could sit up so we could talk.  Patient mumbled something unintelligible and never opened her eyes.  FNE tried to rouse patient, but patient would not wake.  FNE informed charge nurse, Raquel that patient was unable to participate in an exam at this time.  Raquel had provider, Dr. Leory Plowman, re-examine patient.  Patient moved in a room and new orders placed.  Officer Bernerd Pho remains with patient at this time.
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
## 2020-12-06 NOTE — SANE Note (Signed)
Date - 12/06/2020 Patient Name - Susan [name] Patient MRN - [id number] Patient DOB - Apr 08, 1985 Patient Gender - female  EVIDENCE CHECKLIST AND DISPOSITION OF EVIDENCE  I. EVIDENCE COLLECTION  Follow the instructions found in the [place] Sexual [place] Collection Kit.  Clearly identify, date, initial and seal all containers.  Check off items that are collected:   A. Unknown Samples    Collected?     Not Collected?  Why? 1. Outer Clothing X        2. Underpants - Panties    X   PATIENT NOT WEARING ANY  3. Oral Swabs X        4. Pubic Hair Combings    X   PATIENT IS SHAVED  5. Vaginal Swabs X        6. Rectal Swabs  X        7. Toxicology Samples    X   PATIENT WOULD NOT ALLOW BLOOD DRAW  PATIENT BREASTS X        PATIENT NECK EXTERNAL GENITALIA X  X          B. Known Samples:        Collect in every case      Collected?    Not Collected    Why? 1. Pulled Pubic Hair Sample    X   PATIENT IS SHAVED  2. Pulled Head Hair Sample    X   PATIENT DECLINED  3. Known Cheek Scraping X        4. Known Cheek Scraping     X   ONLY ONE COLLECTED         C. Photographs   1. By Whom   PATIENT DECLINED Maceo  2. Describe photographs NA  3. Photo given to  NA         II. DISPOSITION OF EVIDENCE      A. Law Enforcement    1. [place]   2. Officer SEE [place]    1. Officer NA           C. Chain of Custody: See outside of box.

## 2020-12-06 NOTE — ED Notes (Addendum)
Pt to room 13 via wheelchair with [employer].  Pt dressed into hospital gown and her clothing, shirt, pants, bra, were taken by SANE nurse [place].  Pt combative while removing clothing, but eventually allowed clothing to be removed.  Clothing bagged in brown paper bag and removed from room by Arrie Aran, SANE nurse.  Pt has her socks, sneakers, cellphone at bedside.  Labs drawn.  Awaiting cat scan

## 2020-12-08 ENCOUNTER — Other Ambulatory Visit: Payer: Self-pay

## 2020-12-08 ENCOUNTER — Emergency Department
Admission: EM | Admit: 2020-12-08 | Discharge: 2020-12-08 | Disposition: A | Discharge status: Home / Self Care | Payer: Medicare Other | Attending: Emergency Medicine | Admitting: Emergency Medicine

## 2020-12-08 ENCOUNTER — Encounter: Payer: Self-pay | Admitting: Emergency Medicine

## 2020-12-08 DIAGNOSIS — Z87891 Personal history of nicotine dependence: Secondary | ICD-10-CM | POA: Diagnosis not present

## 2020-12-08 DIAGNOSIS — R45851 Suicidal ideations: Secondary | ICD-10-CM

## 2020-12-08 DIAGNOSIS — F250 Schizoaffective disorder, bipolar type: Secondary | ICD-10-CM

## 2020-12-08 DIAGNOSIS — Z046 Encounter for general psychiatric examination, requested by authority: Secondary | ICD-10-CM | POA: Diagnosis not present

## 2020-12-08 DIAGNOSIS — Z20822 Contact with and (suspected) exposure to covid-19: Secondary | ICD-10-CM | POA: Insufficient documentation

## 2020-12-08 DIAGNOSIS — R4689 Other symptoms and signs involving appearance and behavior: Secondary | ICD-10-CM

## 2020-12-08 LAB — RESP PANEL BY RT-PCR (FLU A&B, COVID) ARPGX2
Influenza A by PCR: NEGATIVE
Influenza B by PCR: NEGATIVE
SARS Coronavirus 2 by RT PCR: NEGATIVE
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
## 2020-12-08 NOTE — ED Triage Notes (Signed)
Pt via [facility]PD from hotel. Pt was sent over, pt was being irate and per IVC paperwork stating she wants to kill herself and pulling her hair out. Pt states that she only said that she wanted to her herself because she was angry. Pt has a hx of PTSD, Bipolar Disorder, and Schizophrenia. Denies SI. Denies HI. Denies AVH. Pt is A&Ox4 and NAD.
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
## 2020-12-08 NOTE — ED Provider Notes (Signed)
  Patient seen and evaluated by our psychiatric team who deemed her suitable for outpatient management.  We will discharge with return precautions and referral to [facility].   Vladimir Crofts, MD 12/08/20 [phone]

## 2020-12-08 NOTE — ED Notes (Signed)
Pt belongings include (1/1):   1 pair of black shoes 1 cellphone  1 pair of black and blue sock 1 black shirt  1 black bra 1 pair of jeans 1 black of black underwear

## 2020-12-08 NOTE — ED Notes (Signed)
Pt given her belongings and she dressed herself. Verbal consent given for DC.

## 2021-02-04 ENCOUNTER — Other Ambulatory Visit: Payer: Self-pay

## 2021-02-04 ENCOUNTER — Encounter: Payer: Self-pay | Admitting: Emergency Medicine

## 2021-02-04 ENCOUNTER — Emergency Department
Admission: EM | Admit: 2021-02-04 | Discharge: 2021-02-04 | Disposition: A | Discharge status: Home / Self Care | Payer: Medicare Other | Attending: Student in an Organized Health Care Education/Training Program | Admitting: Student in an Organized Health Care Education/Training Program

## 2021-02-04 DIAGNOSIS — Z87891 Personal history of nicotine dependence: Secondary | ICD-10-CM | POA: Diagnosis not present

## 2021-02-04 DIAGNOSIS — R21 Rash and other nonspecific skin eruption: Secondary | ICD-10-CM | POA: Insufficient documentation

## 2021-02-04 MED ORDER — PERMETHRIN 5 % EX CREA: 1.0000 "application " | TOPICAL_CREAM | Freq: Once | CUTANEOUS | 0 refills | Status: AC

## 2021-02-04 NOTE — ED Provider Notes (Signed)
[facility] Emergency Department Provider Note    Event Date/Time   First MD Initiated Contact with Patient 02/04/21 [phone]     (approximate)  I have reviewed the triage vital signs and the nursing notes.   HISTORY  Chief Complaint Rash    HPI Susan [name] is a 35 y.o. female reported history of schizoaffective disorder presents to the ER for 1 month of itching sensation concerned that she has scabies.  Patient states that they are coming out of her skin coming out of her nose.  Having itching sensation.  States that this happened roughly a month ago when she was staying at a [employer].  Denies any other symptoms   Past Medical History:  Diagnosis Date   Depression    Pituitary microadenoma ([place])    Schizoaffective disorder ([place])    Family History  Problem Relation Age of Onset   Diabetes type II Mother    Depression Mother    Hypertension Paternal Grandmother    Diabetes type II Paternal Grandmother    Past Surgical History:  Procedure Laterality Date   NO PAST SURGERIES     TUBAL LIGATION Bilateral 02/08/2019   Procedure: POST PARTUM TUBAL LIGATION;  Surgeon: Gae Dry, MD;  Location: [facility] ORS;  Service: Gynecology;  Laterality: Bilateral;   Patient Active Problem List   Diagnosis Date Noted   Schizoaffective disorder, bipolar type ([place]) 12/08/2020   Pituitary microadenoma ([place])       Prior to Admission medications   Medication Sig Start Date End Date Taking? Authorizing Provider  permethrin (ELIMITE) 5 % cream Apply 1 application topically once for 1 dose. 02/04/21 02/04/21 Yes Merlyn Lot, MD  amphetamine-dextroamphetamine (ADDERALL XR) 20 MG 24 hr capsule Take 40 mg by mouth daily.     [provider]    Allergies Patient has no known allergies.    Social History Social History   Tobacco Use   Smoking status: Former    Types: Cigarettes    Quit date: 10/2018    Years since quitting: 2.3    Smokeless tobacco: Never  Vaping Use   Vaping Use: Never used  Substance Use Topics   Alcohol use: No   Drug use: No    Review of Systems Patient denies headaches, rhinorrhea, blurry vision, numbness, shortness of breath, chest pain, edema, cough, abdominal pain, nausea, vomiting, diarrhea, dysuria, fevers, rashes or hallucinations unless otherwise stated above in HPI. ____________________________________________   PHYSICAL EXAM:  VITAL SIGNS: Vitals:   02/04/21 0429  BP: (!) 127/91  Pulse: (!) 106  Resp: 20  Temp: 98.4 F (36.9 C)  SpO2: 100%    Constitutional: Alert and oriented. Well appearing and in no acute distress. Eyes: Conjunctivae are normal.  Head: Atraumatic. Nose: No congestion/rhinnorhea. Mouth/Throat: Mucous membranes are moist.   Neck: Painless ROM.  Cardiovascular:   Good peripheral circulation. Respiratory: Normal respiratory effort.  No retractions.  Gastrointestinal: Soft and nontender.  Musculoskeletal: No lower extremity tenderness .  No joint effusions. Neurologic:  Normal speech and language. No gross focal neurologic deficits are appreciated.  Skin:  Skin is warm, dry and intact.  Few areas of abrasion appear consistent with scratch marks.  No burrows no pustules no blistering Psychiatric: Mood and affect are normal. Speech and behavior are normal.  ____________________________________________   LABS (all labs ordered are listed, but only abnormal results are displayed)  No results found for this or any previous visit (from the past 24 hour(s)).  ____________________________________________  ____________________________________________   PROCEDURES  Procedure(s) performed:  Procedures    Critical Care performed: no ____________________________________________   INITIAL IMPRESSION / ASSESSMENT AND PLAN / ED COURSE  Pertinent labs & imaging results that were available during my care of the patient were reviewed by me and considered  in my medical decision making (see chart for details).   DDX: Scabies, bedbugs, delusional parasitosis, drug reaction  Susan [name] is a 35 y.o. who presents to the ED with presentation as described above.  Patient afebrile hemodynamically stable nontoxic-appearing but very anxious.  She is pointing out multiple small dark pieces roughly the size of a head of a pencil she says her insects but they appear more consistent with piece of lint, not bugs.  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  Gave strict return precautions.      ____________________________________________   FINAL CLINICAL IMPRESSION(S) / ED DIAGNOSES  Final diagnoses:  Rash      NEW MEDICATIONS STARTED DURING THIS VISIT:  New Prescriptions   PERMETHRIN (ELIMITE) 5 % CREAM    Apply 1 application topically once for 1 dose.     Note:  This document was prepared using Dragon voice recognition software and may include unintentional dictation errors.     Merlyn Lot, MD 02/04/21 [phone]

## 2021-02-04 NOTE — ED Notes (Signed)
Patient discharged to home per MD order. Patient in stable condition, and deemed medically cleared by ED provider for discharge. Discharge instructions reviewed with patient/family using "Teach Back"; verbalized understanding of medication education and administration, and information about follow-up care. Denies further concerns. ° °

## 2021-02-04 NOTE — ED Triage Notes (Signed)
Pt arrived via ems from hotel. EMS reports pt has black things crawling on pt. Possible scabies x 1 week. Itching all over.

## 2021-02-04 NOTE — ED Notes (Signed)
Pt states she has had itching to skin for 1 month. States she believes it is scabies. No rash noted to skin, pt very anxious in triage.

## 2021-02-04 NOTE — ED Triage Notes (Signed)
Pt presents to ER via EMS from a hotel complaining of itchy sensation and rash. Pt reports having small black bugs crawling on her, her hair scalp. Pt talks in complete sentences no distress noted

## 2021-02-05 ENCOUNTER — Encounter: Payer: Self-pay | Admitting: Emergency Medicine

## 2021-02-05 ENCOUNTER — Emergency Department
Admission: EM | Admit: 2021-02-05 | Discharge: 2021-02-05 | Disposition: A | Discharge status: Home / Self Care | Payer: Medicare Other | Attending: Student in an Organized Health Care Education/Training Program | Admitting: Student in an Organized Health Care Education/Training Program

## 2021-02-05 DIAGNOSIS — Z87891 Personal history of nicotine dependence: Secondary | ICD-10-CM | POA: Insufficient documentation

## 2021-02-05 DIAGNOSIS — L299 Pruritus, unspecified: Secondary | ICD-10-CM | POA: Insufficient documentation

## 2021-02-05 DIAGNOSIS — R21 Rash and other nonspecific skin eruption: Secondary | ICD-10-CM | POA: Diagnosis present

## 2021-02-05 MED ORDER — HYDROXYZINE HCL 50 MG PO TABS
50.0000 mg | ORAL_TABLET | Freq: Once | ORAL | Status: AC
  Administered 2021-02-05: 08:00:00 50 mg via ORAL
  Filled 2021-02-05: qty 1

## 2021-02-05 MED ORDER — HYDROXYZINE HCL 25 MG PO TABS
25.0000 mg | ORAL_TABLET | Freq: Four times a day (QID) | ORAL | 0 refills | Status: AC | PRN
Start: 2021-02-05 — End: ?

## 2021-02-05 NOTE — ED Notes (Signed)
Pt seen yesterday for same complaint. Pt with same symptoms, NAD on arrival to room.

## 2021-02-05 NOTE — ED Provider Notes (Signed)
[facility] Emergency Department Provider Note  ____________________________________________   Event Date/Time   First MD Initiated Contact with Patient 02/05/21 [phone]     (approximate)  I have reviewed the triage vital signs and the nursing notes.   HISTORY  Chief Complaint Foreign Body in Skin   HPI Susan [name] is a 35 y.o. female presents to the ED with complaint of multiple black bugs under her skin.  Patient was seen yesterday for same complaint.  Yesterday she was prescribed promethazine which she states that she used and washed off when she took her bath last evening.  She reports that this all began after she took a bath in [place] when a water pipe broke and bugs got in her hair.  She reports that now the bugs are under her skin causing her to itch.  Patient has a history of schizophrenia disorder/bipolar type.  Patient states that she currently is being seen at [facility] for this.         Past Medical History:  Diagnosis Date   Depression    Pituitary microadenoma ([place])    Schizoaffective disorder [facility])     Patient Active Problem List   Diagnosis Date Noted   Schizoaffective disorder, bipolar type ([place]) 12/08/2020   Pituitary microadenoma [facility])     Past Surgical History:  Procedure Laterality Date   NO PAST SURGERIES     TUBAL LIGATION Bilateral 02/08/2019   Procedure: POST PARTUM TUBAL LIGATION;  Surgeon: Gae Dry, MD;  Location: [facility] ORS;  Service: Gynecology;  Laterality: Bilateral;    Prior to Admission medications   Medication Sig Start Date End Date Taking? Authorizing Provider  hydrOXYzine (ATARAX) 25 MG tablet Take 1 tablet (25 mg total) by mouth every 6 (six) hours as needed for itching. 02/05/21  Yes Johnn Hai, PA-C  amphetamine-dextroamphetamine (ADDERALL XR) 20 MG 24 hr capsule Take 40 mg by mouth daily.     [provider]    Allergies Patient has no known allergies.  Family  History  Problem Relation Age of Onset   Diabetes type II Mother    Depression Mother    Hypertension Paternal Grandmother    Diabetes type II Paternal Grandmother     Social History Social History   Tobacco Use   Smoking status: Former    Types: Cigarettes    Quit date: 10/2018    Years since quitting: 2.3   Smokeless tobacco: Never  Vaping Use   Vaping Use: Never used  Substance Use Topics   Alcohol use: No   Drug use: No    Review of Systems Constitutional: No fever/chills Eyes: No visual changes. Cardiovascular: Denies chest pain. Respiratory: Denies shortness of breath. Gastrointestinal: No abdominal pain.  No nausea, no vomiting.  Genitourinary: Negative for dysuria. Musculoskeletal: Negative for musculoskeletal pain. Skin: Positive for itching and reported bug infestation. Neurological: Negative for headaches, focal weakness or numbness. Psychiatric: History of schizophrenia, bipolar type.  ____________________________________________   PHYSICAL EXAM:  VITAL SIGNS: ED Triage Vitals [02/05/21 0505]  Enc Vitals Group     BP 119/89     Pulse Rate (!) 111     Resp (!) 21     Temp 97.9 F (36.6 C)     Temp Source Oral     SpO2 99 %     Weight      Height      Head Circumference      Peak Flow  Pain Score      Pain Loc      Pain Edu?      Excl. in [place]?     Constitutional: Alert and oriented. Well appearing and in no acute distress. Eyes: Conjunctivae are normal.  Head: Atraumatic. Neck: No stridor.   Cardiovascular: Normal rate, regular rhythm. Grossly normal heart sounds.  Good peripheral circulation. Respiratory: Normal respiratory effort.  No retractions. Lungs CTAB. Gastrointestinal: Soft and nontender. No distention.  Musculoskeletal: Patient is able move upper and lower extremities without any difficulty.  Normal gait was noted. Neurologic:  Normal speech and language. No gross focal neurologic deficits are appreciated. No gait  instability. Skin:  Skin is warm, dry.  No foreign body or insects were seen during inspection of the skin.  On examination of the back there is multiple areas of excoriation without any evidence of infection.  All of these appear to be from scratching at the areas. Psychiatric: Mood and affect are normal. Speech and behavior are normal.  ____________________________________________   LABS (all labs ordered are listed, but only abnormal results are displayed)  Labs Reviewed - No data to display ____________________________________________  PROCEDURES  Procedure(s) performed (including Critical Care):  Procedures   ____________________________________________   INITIAL IMPRESSION / ASSESSMENT AND PLAN / ED COURSE  As part of my medical decision making, I reviewed the following data within the electronic MEDICAL RECORD NUMBER Notes from prior ED visits and [place] Controlled Substance Database  44-year-old female presents to the ED with complaint of itching and bugs coming out of her skin.  She was seen in the ED yesterday at which time she was given lotion for scabies which she states that she did use.  Patient has a history of schizophrenia/bipolar and is questionable whether she is on her medicine or not.  She does have multiple excoriations on her back especially from scratching.  Patient was given Vistaril while in the ED and stated prior to discharge that this is helped greatly and she has less itching than she has in the last 24 hours.  Patient was discharged on a prescription for Atarax every 6 hours as needed for itching.  She is to follow-up with her PCP at [facility] as she states this is where her PCP is.  ____________________________________________   FINAL CLINICAL IMPRESSION(S) / ED DIAGNOSES  Final diagnoses:  Pruritus     ED Discharge Orders          Ordered    hydrOXYzine (ATARAX) 25 MG tablet  Every 6 hours PRN        02/05/21 0930             Note:   This document was prepared using Dragon voice recognition software and may include unintentional dictation errors.    Johnn Hai, PA-C 02/05/21 1450    Merlyn Lot, MD 02/05/21 [phone]

## 2021-02-05 NOTE — Discharge Instructions (Signed)
Follow-up with your regular doctor at [facility] for further evaluation of your itching and also continue medication.  A prescription was sent to [facility] on [address].  Take this medication only as directed.

## 2021-02-05 NOTE — ED Notes (Signed)
This RN reviewed paperwork with pt. No further complaints or questions. Pt ambulated to lobby. Discharge form placed in med rec box.  

## 2021-02-05 NOTE — ED Triage Notes (Signed)
Pt arrived via [facility] from local hotel where pt called out due to bugs embedded into her skin and coming out all over her body. Pt sts it started 1 month ago when she stayed at another hotel when she had to bath in dirty water. Pt sts symptoms started with matted hair and now black "nat" like bugs crawling out of skin and causing pt to itch. Pt seen in ED on 12/26 with no diagnosis of foreign bug. On assessment pt showing black spots on skin that are not removable. Multiple abrasions and skin tears on pts body where she has scratched and continuing to scratch her self.

## 2021-02-09 ENCOUNTER — Emergency Department
Admission: EM | Admit: 2021-02-09 | Discharge: 2021-02-09 | Disposition: A | Discharge status: Home / Self Care | Payer: Medicare Other | Attending: Emergency Medicine | Admitting: Emergency Medicine

## 2021-02-09 ENCOUNTER — Other Ambulatory Visit: Payer: Self-pay

## 2021-02-09 DIAGNOSIS — R531 Weakness: Secondary | ICD-10-CM | POA: Diagnosis not present

## 2021-02-09 DIAGNOSIS — R000 Tachycardia, unspecified: Secondary | ICD-10-CM | POA: Insufficient documentation

## 2021-02-09 DIAGNOSIS — Z87891 Personal history of nicotine dependence: Secondary | ICD-10-CM | POA: Insufficient documentation

## 2021-02-09 DIAGNOSIS — L299 Pruritus, unspecified: Secondary | ICD-10-CM | POA: Diagnosis not present

## 2021-02-09 DIAGNOSIS — L298 Other pruritus: Secondary | ICD-10-CM

## 2021-02-09 LAB — BASIC METABOLIC PANEL
Anion gap: 7 (ref 5–15)
BUN: 18 mg/dL (ref 6–20)
CO2: 25 mmol/L (ref 22–32)
Calcium: 8.9 mg/dL (ref 8.9–10.3)
Chloride: 104 mmol/L (ref 98–111)
Creatinine, Ser: 0.74 mg/dL (ref 0.44–1.00)
GFR, Estimated: >60 mL/min (ref >60)
Glucose, Bld: 96 mg/dL (ref 70–99)
Potassium: 3.6 mmol/L (ref 3.5–5.1)
Sodium: 136 mmol/L (ref 135–145)

## 2021-02-09 LAB — CBC
HCT: 29.5 % — ABNORMAL LOW (ref 36.0–46.0)
Hemoglobin: 9.1 g/dL — ABNORMAL LOW (ref 12.0–15.0)
MCH: 23.2 pg — ABNORMAL LOW (ref 26.0–34.0)
MCHC: 30.8 g/dL (ref 30.0–36.0)
MCV: 75.1 fL — ABNORMAL LOW (ref 80.0–100.0)
Platelets: 341 10*3/uL (ref 150–400)
RBC: 3.93 MIL/uL (ref 3.87–5.11)
RDW: 18.8 % — ABNORMAL HIGH (ref 11.5–15.5)
WBC: 6.2 10*3/uL (ref 4.0–10.5)
nRBC: 0 % (ref 0.0–0.2)

## 2021-02-09 LAB — URINALYSIS, ROUTINE W REFLEX MICROSCOPIC
Bilirubin Urine: NEGATIVE
Glucose, UA: NEGATIVE mg/dL
Hgb urine dipstick: NEGATIVE
Ketones, ur: NEGATIVE mg/dL
Nitrite: NEGATIVE
Protein, ur: 30 mg/dL — AB
Specific Gravity, Urine: 1.029 (ref 1.005–1.030)
pH: 5 (ref 5.0–8.0)

## 2021-02-09 LAB — PREGNANCY, URINE: Preg Test, Ur: NEGATIVE

## 2021-02-09 NOTE — ED Provider Notes (Signed)
Emergency Medicine Provider Triage Evaluation Note  Susan BARCENAS , a 36 y.o. female  was evaluated in triage.  Pt complains of bugs underneath her skin causing sores and weakness.  Patient has history of mental health problems.  Denies chest pain or shortness of breath.  Review of Systems  Positive: Sensation of bugs under her skin, states they are coming through sores, if you put a cream on her leg you can see the bugs Negative: Chest pain, shortness of breath, fever chills  Physical Exam  BP 136/85 (BP Location: Right Arm)    Pulse (!) 110    Temp 98.3 F (36.8 C) (Oral)    Resp 20    LMP 01/27/2021 (Approximate)    SpO2 100%  Gen:   Awake, no distress   Resp:  Normal effort  MSK:   Moves extremities without difficulty  Other:    Medical Decision Making  Medically screening exam initiated at 3:38 PM.  Appropriate orders placed.  SABRIYAH WILCHER was informed that the remainder of the evaluation will be completed by another provider, this initial triage assessment does not replace that evaluation, and the importance of remaining in the ED until their evaluation is complete.  Patient will be taken to psych   Versie Starks, PA-C 02/09/21 1539    Naaman Plummer, MD 02/15/21 [phone]

## 2021-02-09 NOTE — ED Provider Notes (Signed)
[facility] Emergency Department Provider Note   ____________________________________________   Event Date/Time   First MD Initiated Contact with Patient 02/09/21 1714     (approximate)  I have reviewed the triage vital signs and the nursing notes.   HISTORY  Chief Complaint Weakness    HPI Susan [name] is a 35 y.o. female who presents for sensation of pruritus throughout her entire body.  This is the third visit within the last 5 days for similar symptoms.  Patient has been placed on medication that she states she is already used and is not improving her symptoms.  Patient states that she is having "black bugs crawling out of my skin".  Patient is attempting to show me these multiple black insects however they are small pieces of skin.  When this is explained to the patient she states "no one will believe me".  Patient then refused to answer any further questions stating "I am just going to leave then".          Past Medical History:  Diagnosis Date   Depression    Pituitary microadenoma ([place])    Schizoaffective disorder [facility])     Patient Active Problem List   Diagnosis Date Noted   Schizoaffective disorder, bipolar type ([place]) 12/08/2020   Pituitary microadenoma [facility])     Past Surgical History:  Procedure Laterality Date   NO PAST SURGERIES     TUBAL LIGATION Bilateral 02/08/2019   Procedure: POST PARTUM TUBAL LIGATION;  Surgeon: Gae Dry, MD;  Location: [facility] ORS;  Service: Gynecology;  Laterality: Bilateral;    Prior to Admission medications   Medication Sig Start Date End Date Taking? Authorizing Provider  amphetamine-dextroamphetamine (ADDERALL XR) 20 MG 24 hr capsule Take 40 mg by mouth daily.     [provider]  hydrOXYzine (ATARAX) 25 MG tablet Take 1 tablet (25 mg total) by mouth every 6 (six) hours as needed for itching. 02/05/21   Johnn Hai, PA-C    Allergies Patient has no known  allergies.  Family History  Problem Relation Age of Onset   Diabetes type II Mother    Depression Mother    Hypertension Paternal Grandmother    Diabetes type II Paternal Grandmother     Social History Social History   Tobacco Use   Smoking status: Former    Types: Cigarettes    Quit date: 10/2018    Years since quitting: 2.3   Smokeless tobacco: Never  Vaping Use   Vaping Use: Never used  Substance Use Topics   Alcohol use: No   Drug use: No   Review of Systems Constitutional: No fever/chills Eyes: No visual changes. ENT: No sore throat. Cardiovascular: Denies chest pain. Respiratory: Denies shortness of breath. Gastrointestinal: No abdominal pain.  No nausea, no vomiting.  No diarrhea. Genitourinary: Negative for dysuria. Musculoskeletal: Negative for acute arthralgias Skin: Multiple areas of excoriation over the entire body.  Negative for rash. Neurological: Negative for headaches, weakness/numbness/paresthesias in any extremity Psychiatric: Negative for suicidal ideation/homicidal ideation ____________________________________________ PHYSICAL EXAM:  VITAL SIGNS: ED Triage Vitals  Enc Vitals Group     BP 02/09/21 1535 136/85     Pulse Rate 02/09/21 1535 (!) 110     Resp 02/09/21 1535 20     Temp 02/09/21 1535 98.3 F (36.8 C)     Temp Source 02/09/21 1535 Oral     SpO2 02/09/21 1535 100 %     Weight 02/09/21 1604 158 lb 11.7  oz (72 kg)     Height 02/09/21 1604 5\' 7"  (1.702 m)     Head Circumference --      Peak Flow --      Pain Score 02/09/21 1604 1     Pain Loc --      Pain Edu? --      Excl. in [place]? --    Constitutional: Alert and oriented. Well appearing and in no acute distress. Eyes: Conjunctivae are normal. PERRL. Head: Atraumatic. Nose: No congestion/rhinnorhea. Mouth/Throat: Mucous membranes are moist. Neck: No stridor Cardiovascular: Grossly normal heart sounds.  Good peripheral circulation. Respiratory: Normal respiratory effort.  No  retractions. Gastrointestinal: Soft and nontender. No distention. Musculoskeletal: No obvious deformities Neurologic:  Normal speech and language. No gross focal neurologic deficits are appreciated. Skin: Multiple areas of excoriation over bilateral upper extremities, trunk, and bilateral lower extremities.  Skin is warm and dry. No rash noted. Psychiatric: Mood and affect are normal. Speech and behavior are normal. ____________________________________________  LABS (all labs ordered are listed, but only abnormal results are displayed)  Labs Reviewed  CBC - Abnormal; Notable for the following components:      Result Value   Hemoglobin 9.1 (*)    HCT 29.5 (*)    MCV 75.1 (*)    MCH 23.2 (*)    RDW 18.8 (*)    All other components within normal limits  URINALYSIS, ROUTINE W REFLEX MICROSCOPIC - Abnormal; Notable for the following components:   Color, Urine YELLOW (*)    APPearance CLOUDY (*)    Protein, ur 30 (*)    Leukocytes,Ua SMALL (*)    Bacteria, UA RARE (*)    All other components within normal limits  BASIC METABOLIC PANEL  PREGNANCY, URINE  CBG MONITORING, ED   ____________________________________________  EKG  ED ECG REPORT I, Naaman Plummer, the attending physician, personally viewed and interpreted this ECG.  Date: 02/09/2021 EKG Time: 1546 Rate: 117 Rhythm: Tachycardic sinus rhythm QRS Axis: normal Intervals: normal ST/T Wave abnormalities: normal Narrative Interpretation: Tachycardic sinus rhythm.  No evidence of acute ischemia  PROCEDURES  Procedure(s) performed (including Critical Care):  Procedures   ____________________________________________   INITIAL IMPRESSION / ASSESSMENT AND PLAN / ED COURSE  As part of my medical decision making, I reviewed the following data within the electronic medical record, if available:  Nursing notes reviewed and incorporated, Labs reviewed, EKG interpreted, Old chart reviewed, Radiograph reviewed and Notes from  prior ED visits reviewed and incorporated        Patient is a 35 year old female with a past medical history of schizoaffective disorder/bipolar disorder who presents with complaints of of entire body pruritus and the sensation of "bugs crawling underneath my skin".  No evidence of any insects or parasites on physical exam.  Patient's small black "bugs" that she feels she is pulling out of her skin seems to be dead skin cells that she is sloughing off with this topical cream.  This was explained to patient however she does not feel that this is the correct diagnosis.  Patient was offered psychiatric evaluation at this time however she denies any auditory/visual hallucinations, suicidal ideation, or homicidal ideation.  Patient does not meet criteria for involuntary commitment at this time.  Patient states that she wants to leave.  Patient shows no red flag symptomatology at this time.  Patient was explained that she will need to follow-up with a primary care physician [facility] as [facility] and  she expressed understanding.      ____________________________________________   FINAL CLINICAL IMPRESSION(S) / ED DIAGNOSES  Final diagnoses:  Chronic pruritic rash in adult     ED Discharge Orders     None        Note:  This document was prepared using Dragon voice recognition software and may include unintentional dictation errors.    Naaman Plummer, MD 02/09/21 1900

## 2021-02-09 NOTE — ED Notes (Signed)
Pt in the bed scratching all over her body but particularly her head. Pt states she has bug crawling all over her and on the inside of her. Pt states she has them all over her "coochie". Pt states she lives at a hotel and denies any bed bugs that she knows of. Pt was given a cup to place any bugs that she pulls off of her in the cup.

## 2021-02-09 NOTE — ED Notes (Signed)
Pt ambulatory into ED with EMS. Pt was standing at the bridge with EMS when this RN heard a loud thud. This RN looked over to see pt laying in the floor, face down. Pt was alert but would not turn herself over, pt had to be turned over by paramedic attending to her. Pt awake and talking to EMS stating that she is weak. Pt would not assist with getting her out of the floor. Pt kept repeating that she was weak and that she had something coming out of her skin. When pt was placed in a wheelchair she let her shoes fall in the floor. Pt did not have any obvious injuries noted.   First nurse informed that pt provider doing MSEs in triage could evaluate the pt and order appropriate testing if needed.

## 2021-02-09 NOTE — ED Triage Notes (Signed)
Pt with c/o weakness, pt states she has different stages of bugs living in her skin, on her feet, legs, scalp and vaginal area, and scalp. Pt shaking and crying at times, stating she wants someone to believe her. Pt stating that spots on skin and scabs on body are all infested with bugs. Pt states she thinks her urine smells like death. Pt picking and crying and scratching at skin and then will stop and talk about bugs living in her skin and asking this RN to believe her.

## 2021-02-09 NOTE — ED Notes (Signed)
Pt boyfriend at bedside stating he as well has bug crawling all over him as well.

## 2021-05-08 IMAGING — CT CT CERVICAL SPINE W/O CM
3 of 4 series · 13 of 33 positions shown, 16 images · non-contrast
Comparison: None.

CLINICAL DATA: Neck trauma

EXAM:
CT CERVICAL SPINE WITHOUT CONTRAST
TECHNIQUE: Multidetector CT imaging of the cervical spine was performed without
intravenous contrast. Multiplanar CT image reconstructions were also
generated.

[Series 5: sagittal bone · sagittal · 0.30mm/px · 5 of 59 slices shown, 6 images]
[im 20/59  bone]
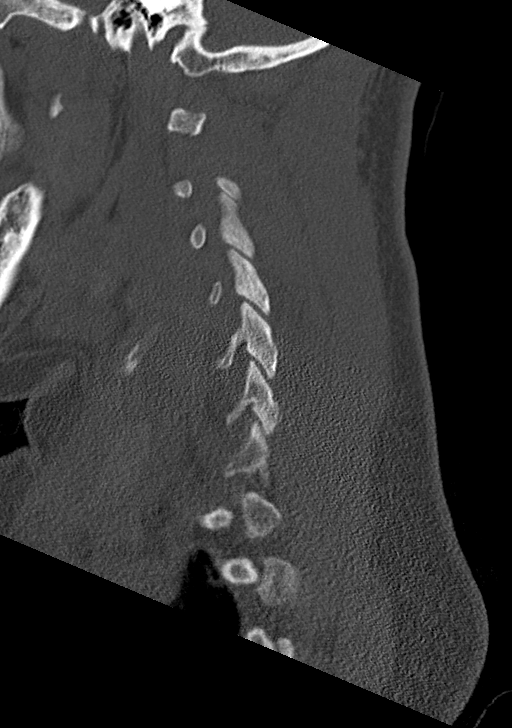
[im 25/59  bone]
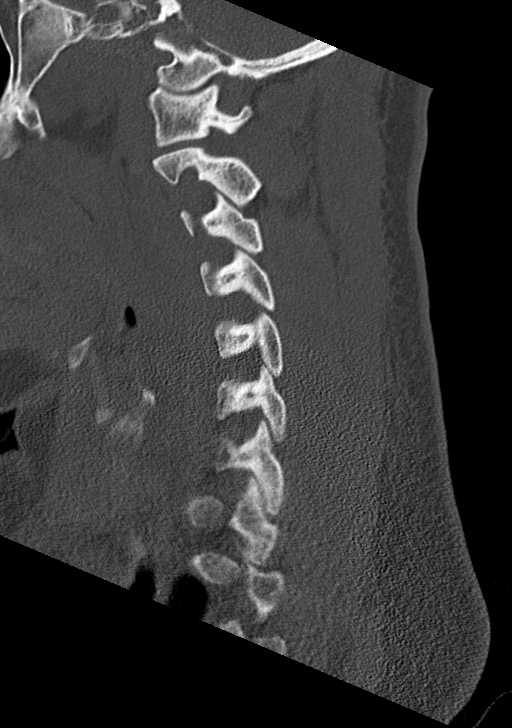
[im 30/59  soft-tissue]
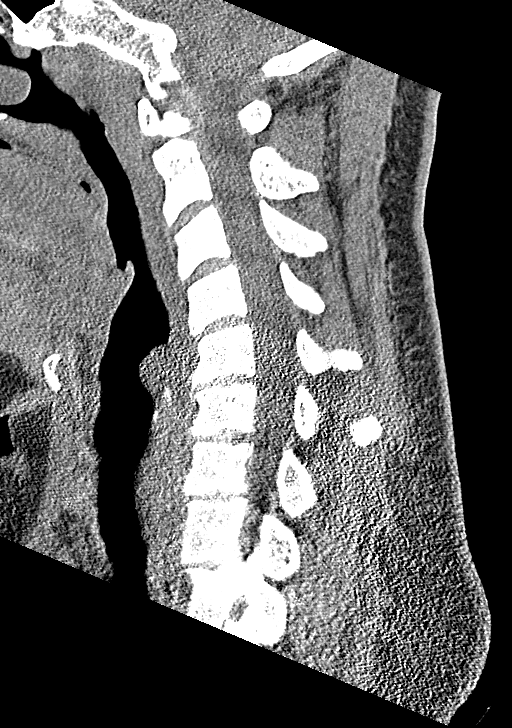
[im 30/59  bone]
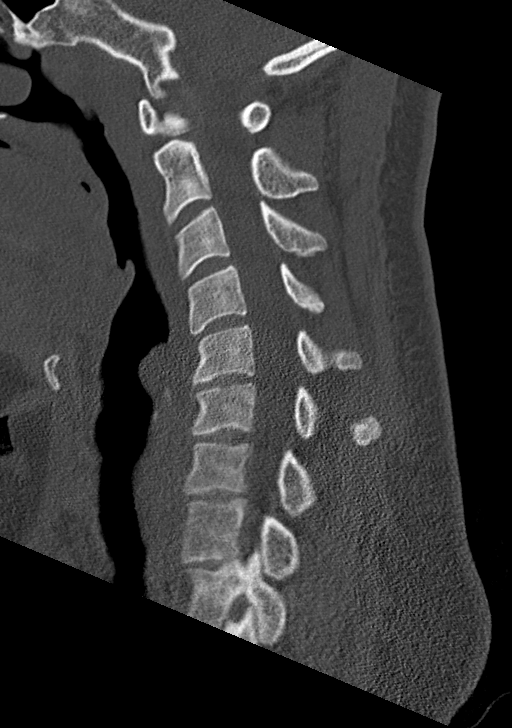
[im 34/59  bone]
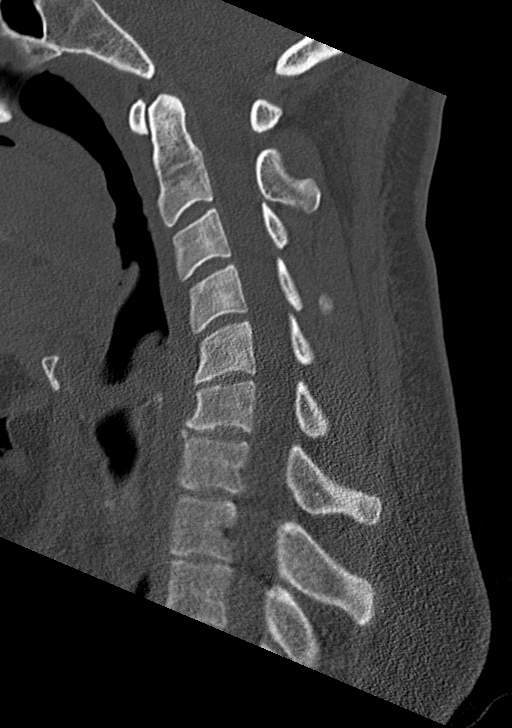
[im 39/59  bone]
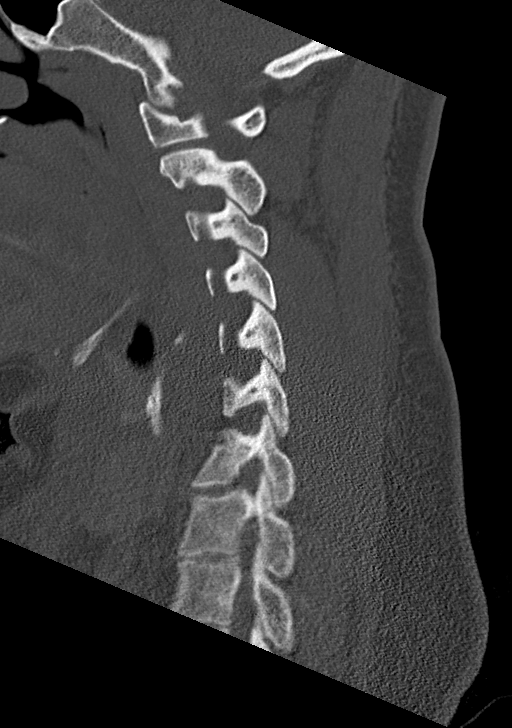

[Series 6: coronal bone · coronal · 0.31mm/px · 3 of 59 slices shown]
[im 12/59  bone]
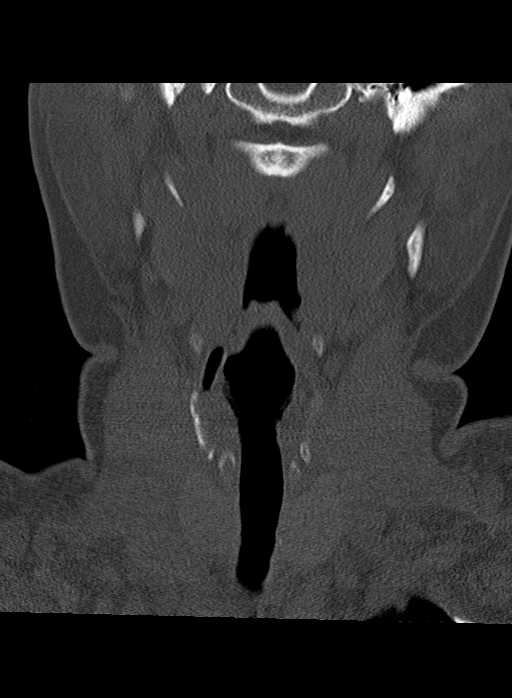
[im 24/59  bone]
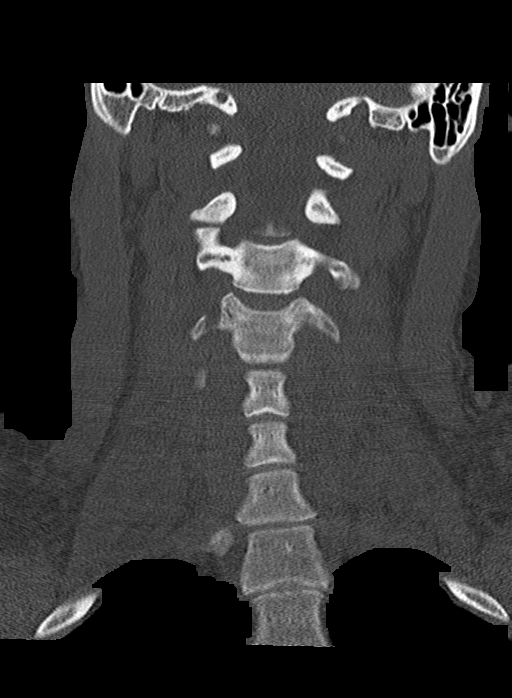
[im 35/59  bone]
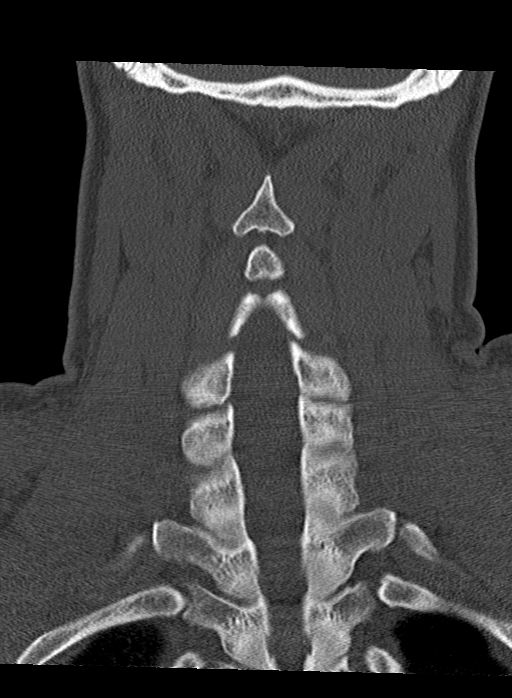

[Series 7: orthogonal bone · axial · 0.30mm/px · z∈[-277,-136]mm · 5 of 107 slices shown, 7 images]
[im 16/107  soft-tissue]
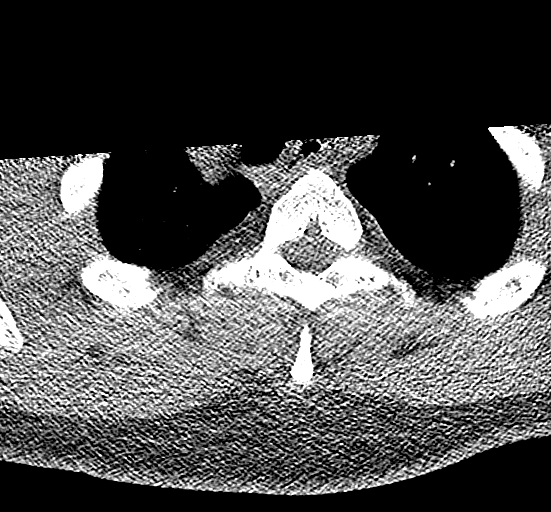
[im 16/107  bone]
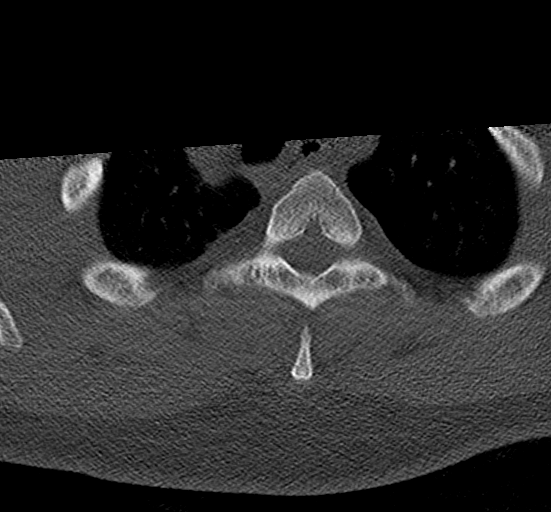
[im 31/107  bone]
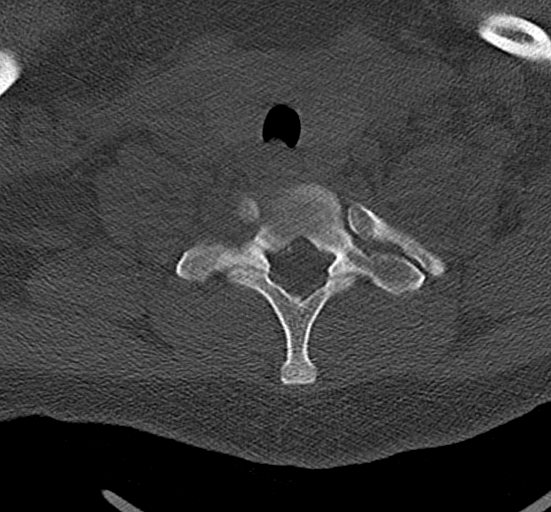
[im 61/107  bone]
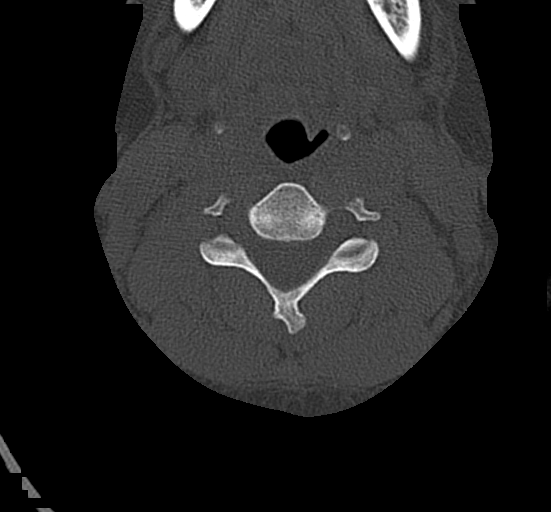
[im 76/107  bone]
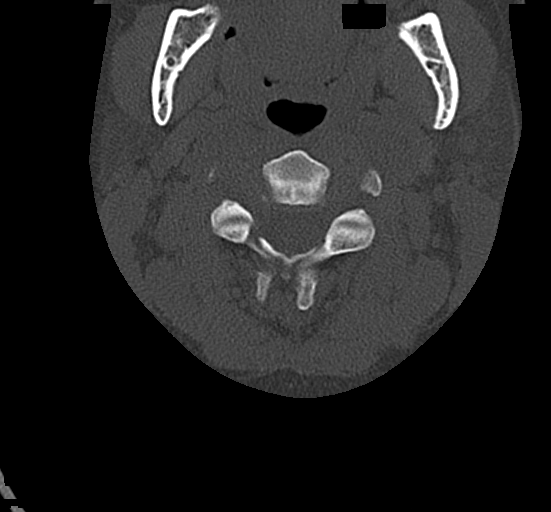
[im 91/107  soft-tissue]
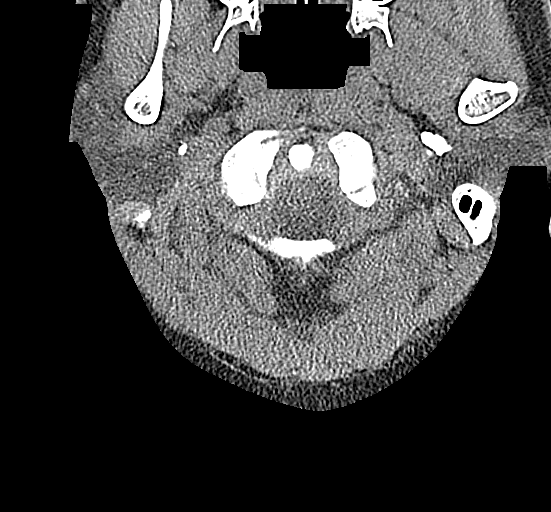
[im 91/107  bone]
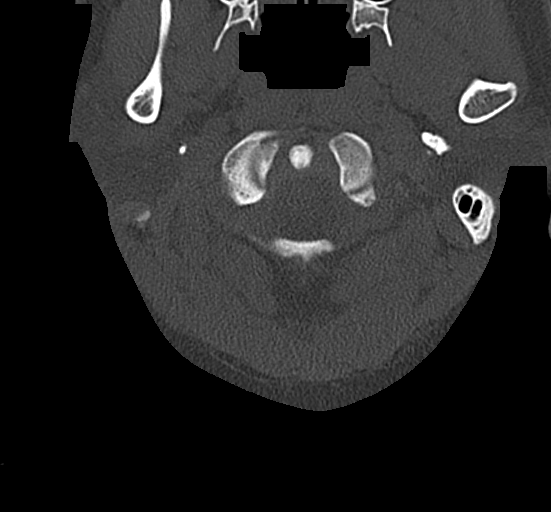

[13 of 33 positions shown; findings below may reference images not displayed]

FINDINGS: Alignment: Loss of cervical lordosis.  No subluxation.

Skull base and vertebrae: No acute fracture. No primary bone lesion
or focal pathologic process.

Soft tissues and spinal canal: No prevertebral fluid or swelling. No
visible canal hematoma.

Disc levels:  Maintain

Upper chest: Negative

Other: None
IMPRESSION: No acute bony abnormality.

Loss of cervical lordosis which may be positional or related to
muscle spasm.

## 2022-02-20 ENCOUNTER — Emergency Department
Admission: EM | Admit: 2022-02-20 | Discharge: 2022-02-22 | Disposition: A | Discharge status: Home / Self Care | Payer: Medicare Other | Attending: Student in an Organized Health Care Education/Training Program | Admitting: Student in an Organized Health Care Education/Training Program

## 2022-02-20 ENCOUNTER — Other Ambulatory Visit: Payer: Self-pay

## 2022-02-20 DIAGNOSIS — R451 Restlessness and agitation: Secondary | ICD-10-CM | POA: Diagnosis not present

## 2022-02-20 DIAGNOSIS — F1410 Cocaine abuse, uncomplicated: Secondary | ICD-10-CM | POA: Diagnosis not present

## 2022-02-20 DIAGNOSIS — F250 Schizoaffective disorder, bipolar type: Secondary | ICD-10-CM | POA: Diagnosis present

## 2022-02-20 DIAGNOSIS — F29 Unspecified psychosis not due to a substance or known physiological condition: Secondary | ICD-10-CM | POA: Diagnosis not present

## 2022-02-20 DIAGNOSIS — Z79899 Other long term (current) drug therapy: Secondary | ICD-10-CM | POA: Insufficient documentation

## 2022-02-20 DIAGNOSIS — Z1152 Encounter for screening for COVID-19: Secondary | ICD-10-CM | POA: Diagnosis not present

## 2022-02-20 LAB — COMPREHENSIVE METABOLIC PANEL
ALT: 45 U/L — ABNORMAL HIGH (ref 0–44)
AST: 152 U/L — ABNORMAL HIGH (ref 15–41)
Albumin: 4.2 g/dL (ref 3.5–5.0)
Alkaline Phosphatase: 53 U/L (ref 38–126)
Anion gap: 9 (ref 5–15)
BUN: 23 mg/dL — ABNORMAL HIGH (ref 6–20)
CO2: 21 mmol/L — ABNORMAL LOW (ref 22–32)
Calcium: 8.5 mg/dL — ABNORMAL LOW (ref 8.9–10.3)
Chloride: 108 mmol/L (ref 98–111)
Creatinine, Ser: 0.8 mg/dL (ref 0.44–1.00)
GFR, Estimated: >60 mL/min (ref >60)
Glucose, Bld: 110 mg/dL — ABNORMAL HIGH (ref 70–99)
Potassium: 3.8 mmol/L (ref 3.5–5.1)
Sodium: 138 mmol/L (ref 135–145)
Total Bilirubin: 0.6 mg/dL (ref 0.3–1.2)
Total Protein: 7.2 g/dL (ref 6.5–8.1)

## 2022-02-20 LAB — CBC
HCT: 33.1 % — ABNORMAL LOW (ref 36.0–46.0)
Hemoglobin: 10.4 g/dL — ABNORMAL LOW (ref 12.0–15.0)
MCH: 25.7 pg — ABNORMAL LOW (ref 26.0–34.0)
MCHC: 31.4 g/dL (ref 30.0–36.0)
MCV: 81.7 fL (ref 80.0–100.0)
Platelets: 243 10*3/uL (ref 150–400)
RBC: 4.05 MIL/uL (ref 3.87–5.11)
RDW: 15.6 % — ABNORMAL HIGH (ref 11.5–15.5)
WBC: 9.4 10*3/uL (ref 4.0–10.5)
nRBC: 0 % (ref 0.0–0.2)

## 2022-02-20 LAB — ACETAMINOPHEN LEVEL: Acetaminophen (Tylenol), Serum: <10 ug/mL — ABNORMAL LOW (ref 10–30)

## 2022-02-20 LAB — SALICYLATE LEVEL: Salicylate Lvl: <7 mg/dL — ABNORMAL LOW (ref 7.0–30.0)

## 2022-02-20 LAB — ETHANOL: Alcohol, Ethyl (B): <10 mg/dL (ref <10)

## 2022-02-20 MED ORDER — HALOPERIDOL LACTATE 5 MG/ML IJ SOLN
5.0000 mg | Freq: Once | INTRAMUSCULAR | Status: AC
  Administered 2022-02-20: 5 mg via INTRAMUSCULAR
  Filled 2022-02-20: qty 1

## 2022-02-20 MED ORDER — MIDAZOLAM HCL 2 MG/2ML IJ SOLN
2.0000 mg | Freq: Once | INTRAMUSCULAR | Status: AC
  Administered 2022-02-20: 2 mg via INTRAMUSCULAR
  Filled 2022-02-20: qty 2

## 2022-02-20 MED ORDER — DIPHENHYDRAMINE HCL 50 MG/ML IJ SOLN
25.0000 mg | Freq: Once | INTRAMUSCULAR | Status: AC
  Administered 2022-02-20: 25 mg via INTRAMUSCULAR
  Filled 2022-02-20: qty 1

## 2022-02-20 NOTE — ED Notes (Addendum)
Pt dressed out by this RN and Sydni, EDT. Belongings are as follow:  Bag 1 of 2 (bag from jail):  Location manager from jail Comb  Blue jeans  Matches Lighter Book Bag of medications   Bag of 1 of 2:  Black shirt Blue leggings Black shoes White socks White bra Purple underwear Hair tie

## 2022-02-20 NOTE — ED Triage Notes (Signed)
Pt to ED via POV from home. Pt is here voluntary for mental health evaluation. Pt reports hx schizophrenia and was recently in jail and didn't have her medications. Pt states since being out of jail medications have not been resumes. Pt reports insomnia and auditory/visual hallucinations. Pt also endorses HA. Pt tearful in triage. Pt reports etoh use last night but threw it up. Pt denies regular etoh use. Pt also reports did use crack today.

## 2022-02-20 NOTE — ED Notes (Signed)
First nurse note: Pt here for issues with mental health, pt states recently in prison and taken off all of her meds and "needs help and her meds back".

## 2022-02-20 NOTE — ED Notes (Signed)
Unable to obtain repeat vital signs due to pt sleeping.

## 2022-02-20 NOTE — ED Provider Notes (Signed)
[facility] Provider Note    Event Date/Time   First MD Initiated Contact with Patient 02/20/22 1841     (approximate)   History   Mental Health Problem  Level V Caveat: psychosis  HPI  Susan [name] is a 37 y.o. female presents to the ER from home with friend reports history of schizophrenia recently in jail did not have her medications.  States that she has been using drugs today including crack and alcohol.  Patient very paranoid and feels like someone is coming after her.  States that she is got things: All over her body.  Denies any SI but very bizarre agitated behavior.  She is acting like she is doing comfortable in the room.     Physical Exam   Triage Vital Signs: ED Triage Vitals [02/20/22 1824]  Enc Vitals Group     BP (!) 142/103     Pulse Rate (!) 125     Resp 18     Temp 99.5 F (37.5 C)     Temp Source Oral     SpO2 100 %     Weight      Height      Head Circumference      Peak Flow      Pain Score 0     Pain Loc      Pain Edu?      Excl. in [place]?     Most recent vital signs: Vitals:   02/20/22 1824  BP: (!) 142/103  Pulse: (!) 125  Resp: 18  Temp: 99.5 F (37.5 C)  SpO2: 100%     Constitutional: Alert, disheveled Eyes: Conjunctivae are normal.  Head: Atraumatic. Nose: No congestion/rhinnorhea. Mouth/Throat: Mucous membranes are moist.   Neck: Painless ROM.  Cardiovascular:   Good peripheral circulation. Respiratory: Normal respiratory effort.  No retractions.  Gastrointestinal: Soft and nontender.  Musculoskeletal:  no deformity Neurologic:  MAE spontaneously. No gross focal neurologic deficits are appreciated.  Skin:  Skin is warm, dry and intact. No rash noted. Psychiatric:  agitated, odd    ED Results / Procedures / Treatments   Labs (all labs ordered are listed, but only abnormal results are displayed) Labs Reviewed  COMPREHENSIVE METABOLIC PANEL - Abnormal; Notable for the following components:       Result Value   CO2 21 (*)    Glucose, Bld 110 (*)    BUN 23 (*)    Calcium 8.5 (*)    AST 152 (*)    ALT 45 (*)    All other components within normal limits  SALICYLATE LEVEL - Abnormal; Notable for the following components:   Salicylate Lvl <8.0 (*)    All other components within normal limits  ACETAMINOPHEN LEVEL - Abnormal; Notable for the following components:   Acetaminophen (Tylenol), Serum <10 (*)    All other components within normal limits  CBC - Abnormal; Notable for the following components:   Hemoglobin 10.4 (*)    HCT 33.1 (*)    MCH 25.7 (*)    RDW 15.6 (*)    All other components within normal limits  ETHANOL  URINE DRUG SCREEN, QUALITATIVE ([facility] ONLY)  POC URINE PREG, ED      RADIOLOGY    PROCEDURES:  Critical Care performed: Yes, see critical care procedure note(s)  .Critical Care  Performed by: Merlyn Lot, MD Authorized by: Merlyn Lot, MD   Critical care provider statement:    Critical care time (minutes):  35   Critical care was necessary to treat or prevent imminent or life-threatening deterioration of the following conditions:  [place]   Critical care was time spent personally by me on the following activities:  Ordering and performing treatments and interventions, ordering and review of laboratory studies, ordering and review of radiographic studies, pulse oximetry, re-evaluation of patient's condition, review of old charts, obtaining history from patient or surrogate, examination of patient, evaluation of patient's response to treatment, discussions with primary provider, discussions with consultants and development of treatment plan with patient or surrogate    MEDICATIONS ORDERED IN ED: Medications  haloperidol lactate (HALDOL) injection 5 mg (5 mg Intramuscular Given 02/20/22 1857)  midazolam (VERSED) injection 2 mg (2 mg Intramuscular Given 02/20/22 1857)  diphenhydrAMINE (BENADRYL) injection 25 mg (25 mg Intramuscular  Given 02/20/22 1858)     IMPRESSION / MDM / [place] / ED COURSE  I reviewed the triage vital signs and the nursing notes.                              Differential diagnosis includes, but is not limited to, Psychosis, delirium, medication effect, noncompliance, polysubstance abuse, Si, Hi, depression   Patient presenting to the ER for evaluation of symptoms as described above.  Based on symptoms, risk factors and considered above differential, this presenting complaint could reflect a potentially life-threatening illness therefore the patient will be placed on continuous pulse oximetry and telemetry for monitoring.  Laboratory evaluation will be sent to evaluate for the above complaints.    Patient has psych history of scizophrenia.  Laboratory testing was ordered to evaluation for underlying electrolyte derangement or signs of underlying organic pathology to explain today's presentation.  Based on history and physical and laboratory evaluation, it appears that the patient's presentation is 2/2 underlying psychiatric disorder  worsened by substance abuse and will require further evaluation and management by inpatient psychiatry.  Patient was  made an IVC due to agitation and concern for acute psychosis in the setting of substance abuse.  Patient was agitated disorganized enough that she required IM sedation with Haldol, Benadryl, Versed.  Responded well to this.  Observed on monitor..  Disposition pending psychiatric evaluation.  The patient has been placed in psychiatric observation due to the need to provide a safe environment for the patient while obtaining psychiatric consultation and evaluation, as well as ongoing medical and medication management to treat the patient's condition.  The patient has been placed under full IVC at this time.        FINAL CLINICAL IMPRESSION(S) / ED DIAGNOSES   Final diagnoses:  Psychosis, unspecified psychosis type ([place])     Rx / DC Orders    ED Discharge Orders     None        Note:  This document was prepared using Dragon voice recognition software and may include unintentional dictation errors.    Merlyn Lot, MD 02/20/22 2237

## 2022-02-20 NOTE — ED Notes (Addendum)
Pt resting quietly on stretcher with lights off to enhance rest. Eyes closed and even respirations. No distress noted at this time.

## 2022-02-20 NOTE — ED Notes (Signed)
Pt came out into hallway yelling, officer redirected pt to room.  This rn and md in with pt.  Meds ordered and given.  Pt uncooperative and was "kung foo fighting" and singing at staff while doing "karate moves" before injections given by rn.

## 2022-02-21 DIAGNOSIS — F250 Schizoaffective disorder, bipolar type: Secondary | ICD-10-CM | POA: Diagnosis not present

## 2022-02-21 DIAGNOSIS — F1410 Cocaine abuse, uncomplicated: Secondary | ICD-10-CM | POA: Diagnosis present

## 2022-02-21 LAB — POC URINE PREG, ED: Preg Test, Ur: NEGATIVE

## 2022-02-21 LAB — URINE DRUG SCREEN, QUALITATIVE (ARMC ONLY)
Amphetamines, Ur Screen: NOT DETECTED
Barbiturates, Ur Screen: NOT DETECTED
Benzodiazepine, Ur Scrn: POSITIVE — AB
Cannabinoid 50 Ng, Ur ~~LOC~~: NOT DETECTED
Cocaine Metabolite,Ur ~~LOC~~: POSITIVE — AB
MDMA (Ecstasy)Ur Screen: NOT DETECTED
Methadone Scn, Ur: NOT DETECTED
Opiate, Ur Screen: NOT DETECTED
Phencyclidine (PCP) Ur S: NOT DETECTED
Tricyclic, Ur Screen: NOT DETECTED

## 2022-02-21 MED ORDER — QUETIAPINE FUMARATE 200 MG PO TABS
200.0000 mg | ORAL_TABLET | Freq: Every day | ORAL | Status: DC
  Administered 2022-02-21: 200 mg via ORAL
  Filled 2022-02-21: qty 1

## 2022-02-21 MED ORDER — GABAPENTIN 300 MG PO CAPS
300.0000 mg | ORAL_CAPSULE | Freq: Three times a day (TID) | ORAL | Status: DC
  Administered 2022-02-21 – 2022-02-22 (×3): 300 mg via ORAL
  Filled 2022-02-21 (×3): qty 1

## 2022-02-21 MED ORDER — DULOXETINE HCL 30 MG PO CPEP
30.0000 mg | ORAL_CAPSULE | Freq: Every day | ORAL | Status: DC
  Administered 2022-02-21 – 2022-02-22 (×2): 30 mg via ORAL
  Filled 2022-02-21 (×2): qty 1

## 2022-02-21 NOTE — BH Assessment (Addendum)
Patient has been accepted to [facility].  Patient assigned to [facility] Accepting physician is Dr. Maretta Bees.  Call report to [phone] (pager).  Representative was Bri.   ER Staff is aware of it:  Apolonio Schneiders, ER Secretary       Address:  [place], [place] [address]   Patient can arrive anytime tomorrow (02/22/2022) after 8am

## 2022-02-21 NOTE — ED Notes (Signed)
Breakfast placed at side.

## 2022-02-21 NOTE — ED Notes (Signed)
IVC/pending transport to [facility] after [employer]

## 2022-02-21 NOTE — ED Notes (Signed)
Pt accepted to [facility] in the am on 02/22/2022

## 2022-02-21 NOTE — ED Notes (Signed)
Pt is continues asleep. VS will be assess when pt is awake.

## 2022-02-21 NOTE — ED Notes (Signed)
Pt given lunch tray and drink. Pt calm and cooperative at this time

## 2022-02-21 NOTE — BH Assessment (Signed)
Comprehensive Clinical Assessment (CCA) Note  02/21/2022 Susan [name] [id number]  Chief Complaint:  Chief Complaint  Patient presents with   Mental Health Problem   Visit Diagnosis: Schizoaffective, bipolar type.    Susan [name] is a 37 year old female who presents to the ER due to hallucinating. She states she hasn't had her medications in several weeks and what she's experiencing now is a result of it. She also reports, she uses substance to cope with the psychosis. She also reports she hasn't been able to sleep and the friend she's living with has voiced her concerns about the state of her mental health. Patient denies SI/HI.  CCA Screening, Triage and Referral (STR)  Patient Reported Information How did you hear about [place]? Legal System  What Is the Reason for Your Visit/Call Today? Patient currently psychotic and off her medications.  How Long Has This Been Causing You Problems? 1 wk - 1 month  What Do You Feel Would Help You the Most Today? Treatment for Depression or other mood problem   Have You Recently Had Any Thoughts About Hurting Yourself? No  Are You Planning to Commit Suicide/Harm Yourself At This time? No   Flowsheet Row ED from 02/20/2022 in [place] ED from 02/09/2021 in [place] ED from 02/05/2021 in [place] No Risk No Risk No Risk       Have you Recently Had Thoughts About [place]? No  Are You Planning to Harm Someone at This Time? No  Explanation: No data recorded  Have You Used Any Alcohol or Drugs in the Past 24 Hours? Yes  What Did You Use and How Much? Cocaine   Do You Currently Have a Therapist/Psychiatrist? No  Name of Therapist/Psychiatrist:    Have You Been Recently Discharged From Any Office Practice or Programs? No  Explanation of Discharge From  Practice/Program: No data recorded    CCA Screening Triage Referral Assessment Type of Contact: Face-to-Face  Telemedicine Service Delivery:   Is this Initial or Reassessment?   Date Telepsych consult ordered in [facility]:    Time Telepsych consult ordered in [facility]:    [facility] of Assessment[facility] ED  [facility]   Collateral Involvement: No data recorded  Does Patient Have a Outlook? No  Legal Guardian Contact Information: No data recorded Copy of Legal Guardianship Form: No data recorded Legal Guardian Notified of Arrival: No data recorded Legal Guardian Notified of Pending Discharge: No data recorded If Minor and Not Living with Parent(s), Who has Custody? No data recorded Is CPS involved or ever been involved? Never  Is APS involved or ever been involved? Never   Patient Determined To Be At Risk for Harm To Self or Others Based on Review of Patient Reported Information or Presenting Complaint? No data recorded Method: No data recorded Availability of Means: No data recorded Intent: No data recorded Notification Required: No data recorded Additional Information for Danger to Others Potential: No data recorded Additional Comments for Danger to Others Potential: No data recorded Are There Guns or Other Weapons in Your Home? No data recorded Types of Guns/Weapons: No data recorded Are These Weapons Safely Secured?                            No data recorded Who Could Verify You Are Able To Have These  Secured: No data recorded Do You Have any Outstanding Charges, Pending Court Dates, Parole/Probation? No data recorded Contacted To Inform of Risk of Harm To Self or Others: No data recorded   Does Patient Present under Involuntary Commitment? Yes    [place] of Residence: [place]   Patient Currently Receiving the Following Services: Not Receiving Services   Determination of Need: Emergent (2 hours)   Options For Referral: Inpatient  Hospitalization     CCA Biopsychosocial Patient Reported Schizophrenia/Schizoaffective Diagnosis in Past: Yes   Strengths: Have support system, some insight, and seeking help (Treatment)   Mental Health Symptoms Depression:   Worthlessness; Hopelessness; Difficulty Concentrating   Duration of Depressive symptoms:  Duration of Depressive Symptoms: N/A   Mania:   N/A   Anxiety:    N/A   Psychosis:   None   Duration of Psychotic symptoms:    Trauma:   N/A   Obsessions:   N/A   Compulsions:   N/A   Inattention:   N/A   Hyperactivity/Impulsivity:   N/A   Oppositional/Defiant Behaviors:   N/A   Emotional Irregularity:   N/A   Other Mood/Personality Symptoms:  No data recorded   Mental Status Exam Appearance and self-care  Stature:   Average   Weight:   Average weight   Clothing:   Neat/clean; Age-appropriate   Grooming:   Normal   Cosmetic use:   None   Posture/gait:   Normal   Motor activity:   -- (Within normal range)   Sensorium  Attention:   Normal   Concentration:   Normal   Orientation:   X5   Recall/memory:   Normal   Affect and Mood  Affect:   Anxious; Depressed; Full Range   Mood:   Anxious   Relating  Eye contact:   Fleeting   Facial expression:   Anxious; Depressed; Responsive; Tense   Attitude toward examiner:   Cooperative   Thought and Language  Speech flow:  Blocked; Paucity; Pressured   Thought content:   Appropriate to Mood and Circumstances   Preoccupation:   Religion   Hallucinations:   Auditory; Tactile   Organization:   Intact; [place] of Knowledge:   Fair   Intelligence:   Average   Abstraction:   Functional; Concrete   Judgement:   Impaired   Reality Testing:   Distorted   Insight:   Fair   Decision Making:   Confused   Social Functioning  Social Maturity:   Irresponsible   Social Judgement:   "Games developer"; Normal    Stress  Stressors:   Family conflict; Illness   Coping Ability:   Overwhelmed; Exhausted   Skill Deficits:   None   Supports:   Friends/Service system     Religion: Religion/Spirituality Are You A Religious Person?: No  Leisure/Recreation: Leisure / Recreation Do You Have Hobbies?: No  Exercise/Diet: Exercise/Diet Do You Exercise?: No Have You Gained or Lost A Significant Amount of Weight in the Past Six Months?: No Do You Follow a Special Diet?: No Do You Have Any Trouble Sleeping?: No   CCA Employment/Education Employment/Work Situation: Employment / Work Situation Employment Situation: Unemployed Patient's Job has Been Impacted by Current Illness: No Has Patient ever Been in Passenger transport manager?: No  Education: Education Is Patient Currently Attending School?: No Did You Nutritional therapist?: No Did You Have An Individualized Education Program (IIEP): No Did You Have Any Difficulty At [employer]?: No Patient's Education Has Been Impacted  by Current Illness: No   CCA Family/Childhood History Family and Relationship History: Family history Marital status: Single Does patient have children?: No  Childhood History:  Childhood History Did patient suffer any verbal/emotional/physical/sexual abuse as a child?: No Did patient suffer from severe childhood neglect?: No Has patient ever been sexually abused/assaulted/raped as an adolescent or adult?: No Was the patient ever a victim of a crime or a disaster?: No Witnessed domestic violence?: No Has patient been affected by domestic violence as an adult?: No       CCA Substance Use Alcohol/Drug Use: Alcohol / Drug Use Pain Medications: See PTA Prescriptions: See PTA Over the Counter: See PTA History of alcohol / drug use?: No history of alcohol / drug abuse Longest period of sobriety (when/how long): Unable quantify Negative Consequences of Use: Legal      ASAM's:  Six Dimensions of Multidimensional  Assessment  Dimension 1:  Acute Intoxication and/or Withdrawal Potential:      Dimension 2:  Biomedical Conditions and Complications:      Dimension 3:  Emotional, Behavioral, or Cognitive Conditions and Complications:     Dimension 4:  Readiness to Change:     Dimension 5:  Relapse, Continued use, or Continued Problem Potential:     Dimension 6:  Recovery/Living Environment:     ASAM Severity Score:    ASAM Recommended Level of Treatment:     Substance use Disorder (SUD)    Recommendations for Services/Supports/Treatments:    Discharge Disposition:    DSM5 Diagnoses: Patient Active Problem List   Diagnosis Date Noted   Cocaine abuse ([place]) 02/21/2022   Schizoaffective disorder, bipolar type ([place]) 12/08/2020   Pituitary microadenoma ([place])      Referrals to Alternative Service(s): Referred to Alternative Service(s):   Place:   Date:   Time:    Referred to Alternative Service(s):   Place:   Date:   Time:    Referred to Alternative Service(s):   Place:   Date:   Time:    Referred to Alternative Service(s):   Place:   Date:   Time:     Gunnar Fusi MS, LCAS, [facility], [facility] Therapeutic Triage Specialist 02/21/2022 1:47 PM

## 2022-02-21 NOTE — Consult Note (Signed)
[facility] Consult   Reason for Consult:  psychosis, cocaine abuse Referring Physician:  EDP Patient Identification: Susan [name] MRN:  [id number] Principal Diagnosis: Schizoaffective disorder, bipolar type ([employer]) Diagnosis:  Principal Problem:   Schizoaffective disorder, bipolar type ([place]) Active Problems:   Cocaine abuse ([place])   Total Time spent with patient: 45 minutes  Subjective:   Susan [name] is a 37 y.o. female patient admitted with psychosis and cocaine abuse.  HPI:  37 yo female with schizoaffective disorder, bipolar type presented to the ED after being off her medications and experiencing psychosis along with relapse on "crack".  She was in jail until two days ago for three months.  Unclear if she did not have her medications in jail or after being released or both.  The client was emotional on assessment with complaints of leg pains.  She had just awakened prior to the assessment.  Complained of "I saw bugs (on her skin) and made me itch".  High anxiety and depression related to multiple stressors including having her children removed from the home and recently being released from jail.  She continues to be unstable and emotional, cooperative and wants to obtain help for her mental health and substance use disorder.  Past Psychiatric History: schizoaffective disorder, bipolar type; cocaine abuse  Risk to Self:  none Risk to Others:  none Prior Inpatient Therapy:  yes Prior Outpatient Therapy:  none currently  Past Medical History:  Past Medical History:  Diagnosis Date   Depression    Pituitary microadenoma ([place])    Schizoaffective disorder ([place])     Past Surgical History:  Procedure Laterality Date   NO PAST SURGERIES     TUBAL LIGATION Bilateral 02/08/2019   Procedure: POST PARTUM TUBAL LIGATION;  Surgeon: Gae Dry, MD;  Location: [facility] ORS;  Service: Gynecology;  Laterality: Bilateral;   Family History:  Family History  Problem  Relation Age of Onset   Diabetes type II Mother    Depression Mother    Hypertension Paternal Grandmother    Diabetes type II Paternal Grandmother    Family Psychiatric  History: none Social History:  Social History   Substance and Sexual Activity  Alcohol Use No     Social History   Substance and Sexual Activity  Drug Use No    Social History   Socioeconomic History   Marital status: Single    Spouse name: Not on file   Number of children: Not on file   Years of education: Not on file   Highest education level: Not on file  Occupational History   Not on file  Tobacco Use   Smoking status: Former    Types: Cigarettes    Quit date: 10/2018    Years since quitting: 3.3   Smokeless tobacco: Never  Vaping Use   Vaping Use: Never used  Substance and Sexual Activity   Alcohol use: No   Drug use: No   Sexual activity: Yes    Comment: planning surgical  Other Topics Concern   Not on file  Social History Narrative   ** Merged History Encounter **       Social Determinants of Health   Financial Resource Strain: Not on file  Food Insecurity: Not on file  Transportation Needs: Not on file  Physical Activity: Not on file  Stress: Not on file  Social Connections: Not on file   Additional Social History:    Allergies:  No Known Allergies  Labs:  Results for orders placed or performed during the hospital encounter of 02/20/22 (from the past 48 hour(s))  Comprehensive metabolic panel     Status: Abnormal   Collection Time: 02/20/22  6:29 PM  Result Value Ref Range   Sodium 138 135 - 145 mmol/L   Potassium 3.8 3.5 - 5.1 mmol/L   Chloride 108 98 - 111 mmol/L   CO2 21 (L) 22 - 32 mmol/L   Glucose, Bld 110 (H) 70 - 99 mg/dL    Comment: Glucose reference range applies only to samples taken after fasting for at least 8 hours.   BUN 23 (H) 6 - 20 mg/dL   Creatinine, Ser 0.80 0.44 - 1.00 mg/dL   Calcium 8.5 (L) 8.9 - 10.3 mg/dL   Total Protein 7.2 6.5 - 8.1 g/dL    Albumin 4.2 3.5 - 5.0 g/dL   AST 152 (H) 15 - 41 U/L   ALT 45 (H) 0 - 44 U/L   Alkaline Phosphatase 53 38 - 126 U/L   Total Bilirubin 0.6 0.3 - 1.2 mg/dL   GFR, Estimated >60 >60 mL/min    Comment: (NOTE) Calculated using the CKD-EPI Creatinine Equation (2021)    Anion gap 9 5 - 15    Comment: Performed at [facility], [address] [place], [place] [address]  Ethanol     Status: None   Collection Time: 02/20/22  6:29 PM  Result Value Ref Range   Alcohol, Ethyl (B) <10 <10 mg/dL    Comment: (NOTE) Lowest detectable limit for serum alcohol is 10 mg/dL.  For medical purposes only. Performed at [facility], [address] [place], [place] [address]   Salicylate level     Status: Abnormal   Collection Time: 02/20/22  6:29 PM  Result Value Ref Range   Salicylate Lvl <9.6 (L) 7.0 - 30.0 mg/dL    Comment: Performed at [facility], [address] [place], [place] [address]  Acetaminophen level     Status: Abnormal   Collection Time: 02/20/22  6:29 PM  Result Value Ref Range   Acetaminophen (Tylenol), Serum <10 (L) 10 - 30 ug/mL    Comment: (NOTE) Therapeutic concentrations vary significantly. A range of 10-30 ug/mL  may be an effective concentration for many patients. However, some  are best treated at concentrations outside of this range. Acetaminophen concentrations >150 ug/mL at 4 hours after ingestion  and >50 ug/mL at 12 hours after ingestion are often associated with  toxic reactions.  Performed at [facility], [address] [place], [place] [address]   cbc     Status: Abnormal   Collection Time: 02/20/22  6:29 PM  Result Value Ref Range   WBC 9.4 4.0 - 10.5 K/uL   RBC 4.05 3.87 - 5.11 MIL/uL   Hemoglobin 10.4 (L) 12.0 - 15.0 g/dL   HCT 33.1 (L) 36.0 - 46.0 %   MCV 81.7 80.0 - 100.0 fL   MCH 25.7 (L) 26.0 - 34.0 pg   MCHC 31.4 30.0 - 36.0 g/dL   RDW 15.6 (H) 11.5 - 15.5 %   Platelets 243 150 - 400 K/uL   nRBC 0.0  0.0 - 0.2 %    Comment: Performed at [facility], [address], [place], [place] [address]  Urine Drug Screen, Qualitative     Status: Abnormal (Preliminary result)   Collection Time: 02/20/22  6:29 PM  Result Value Ref Range   Tricyclic, Ur Screen NONE DETECTED NONE DETECTED  Amphetamines, Ur Screen NONE DETECTED NONE DETECTED   MDMA (Ecstasy)Ur Screen PENDING NONE DETECTED   Cocaine Metabolite,Ur [place] POSITIVE (A) NONE DETECTED   Opiate, Ur Screen NONE DETECTED NONE DETECTED   Phencyclidine (PCP) Ur S NONE DETECTED NONE DETECTED   Cannabinoid 50 Ng, Ur [place] NONE DETECTED NONE DETECTED   Barbiturates, Ur Screen NONE DETECTED NONE DETECTED   Benzodiazepine, Ur Scrn POSITIVE (A) NONE DETECTED   Methadone Scn, Ur NONE DETECTED NONE DETECTED    Comment: (NOTE) Tricyclics + metabolites, urine    Cutoff 1000 ng/mL Amphetamines + metabolites, urine  Cutoff 1000 ng/mL MDMA (Ecstasy), urine              Cutoff 500 ng/mL Cocaine Metabolite, urine          Cutoff 300 ng/mL Opiate + metabolites, urine        Cutoff 300 ng/mL Phencyclidine (PCP), urine         Cutoff 25 ng/mL Cannabinoid, urine                 Cutoff 50 ng/mL Barbiturates + metabolites, urine  Cutoff 200 ng/mL Benzodiazepine, urine              Cutoff 200 ng/mL Methadone, urine                   Cutoff 300 ng/mL  The urine drug screen provides only a preliminary, unconfirmed analytical test result and should not be used for non-medical purposes. Clinical consideration and professional judgment should be applied to any positive drug screen result due to possible interfering substances. A more specific alternate chemical method must be used in order to obtain a confirmed analytical result. Gas chromatography / mass spectrometry (GC/MS) is the preferred confirm atory method. Performed at [facility], [address] [place], [place] [address]   POC urine preg, ED     Status: None   Collection Time:  02/21/22 10:18 AM  Result Value Ref Range   Preg Test, Ur Negative Negative    Current Facility-Administered Medications  Medication Dose Route Frequency Provider Last Rate Last Admin   DULoxetine (CYMBALTA) DR capsule 30 mg  30 mg Oral Daily Patrecia Pour, NP       QUEtiapine (SEROQUEL) tablet 200 mg  200 mg Oral QHS Patrecia Pour, NP       Current Outpatient Medications  Medication Sig Dispense Refill   amphetamine-dextroamphetamine (ADDERALL XR) 20 MG 24 hr capsule Take 40 mg by mouth daily.  (Patient not taking: Reported on 02/20/2022)     cariprazine (VRAYLAR) 3 MG capsule Take 3 mg by mouth daily. (Patient not taking: Reported on 02/20/2022)     DULoxetine (CYMBALTA) 30 MG capsule Take 30 mg by mouth daily. (Patient not taking: Reported on 02/20/2022)     hydrOXYzine (ATARAX) 25 MG tablet Take 1 tablet (25 mg total) by mouth every 6 (six) hours as needed for itching. (Patient not taking: Reported on 02/20/2022) 20 tablet 0   lamoTRIgine (LAMICTAL) 25 MG tablet Take 25 mg by mouth 2 (two) times daily. (Patient not taking: Reported on 02/20/2022)     Multiple Vitamin (MULTI-VITAMIN) tablet Take 1 tablet by mouth daily. (Patient not taking: Reported on 02/20/2022)     QUEtiapine (SEROQUEL) 300 MG tablet Take 300 mg by mouth at bedtime. (Patient not taking: Reported on 02/20/2022)     zinc sulfate 220 (50 Zn) MG capsule Take 220 mg by mouth daily. (Patient not taking: Reported  on 02/20/2022)      Musculoskeletal: Strength & Muscle Tone: within normal limits Gait & Station: normal Patient leans: N/A  Psychiatric Specialty Exam: Physical Exam Vitals and nursing note reviewed.  Constitutional:      Appearance: Normal appearance.  HENT:     Head: Normocephalic.     Nose: Nose normal.  Pulmonary:     Effort: Pulmonary effort is normal.  Musculoskeletal:        General: Normal range of motion.     Cervical back: Normal range of motion.  Neurological:     General: No focal deficit  present.     Mental Status: She is alert and oriented to person, place, and time.  Psychiatric:        Attention and Perception: Attention and perception normal.        Mood and Affect: Mood is anxious and depressed.        Speech: Speech normal.        Behavior: Behavior normal. Behavior is cooperative.        Thought Content: Thought content is delusional.        Cognition and Memory: Cognition is impaired.        Judgment: Judgment normal.     Review of Systems  Psychiatric/Behavioral:  Positive for depression and substance abuse. The patient is nervous/anxious.   All other systems reviewed and are negative.   Blood pressure 110/80, pulse 96, temperature 98.5 F (36.9 C), temperature source Oral, resp. rate 18, last menstrual period 01/21/2022, SpO2 100 %, unknown if currently breastfeeding.There is no height or weight on file to calculate BMI.  General Appearance: Disheveled  Eye Contact:  Fair  Speech:  Normal Rate  Volume:  Normal  Mood:  Anxious and Depressed  Affect:  Congruent  Thought Process:  Coherent  Orientation:  Full (Time, Place, and Person)  Thought Content:  Delusions  Suicidal Thoughts:  No  Homicidal Thoughts:  No  Memory:  Immediate;   Fair Recent;   Fair Remote;   Fair  Judgement:  Poor  Insight:  Fair  Psychomotor Activity:  Decreased  Concentration:  Concentration: Fair and Attention Span: Fair  Recall:  [employer] of Knowledge:  Fair  Language:  Good  Akathisia:  No  Handed:  Right  AIMS (if indicated):     Assets:  Leisure Time Physical Health Resilience  ADL's:  Intact  Cognition:  WNL  Sleep:        Physical Exam: Physical Exam Vitals and nursing note reviewed.  Constitutional:      Appearance: Normal appearance.  HENT:     Head: Normocephalic.     Nose: Nose normal.  Pulmonary:     Effort: Pulmonary effort is normal.  Musculoskeletal:        General: Normal range of motion.     Cervical back: Normal range of motion.   Neurological:     General: No focal deficit present.     Mental Status: She is alert and oriented to person, place, and time.  Psychiatric:        Attention and Perception: Attention and perception normal.        Mood and Affect: Mood is anxious and depressed.        Speech: Speech normal.        Behavior: Behavior normal. Behavior is cooperative.        Thought Content: Thought content is delusional.        Cognition and Memory:  Cognition is impaired.        Judgment: Judgment normal.    Review of Systems  Psychiatric/Behavioral:  Positive for depression and substance abuse. The patient is nervous/anxious.   All other systems reviewed and are negative.  Blood pressure 110/80, pulse 96, temperature 98.5 F (36.9 C), temperature source Oral, resp. rate 18, last menstrual period 01/21/2022, SpO2 100 %, unknown if currently breastfeeding. There is no height or weight on file to calculate BMI.  Treatment Plan Summary: Daily contact with patient to assess and evaluate symptoms and progress in treatment, Medication management, and Plan : Schizoaffective disorder, bipolar type: Seroquel 200 mg daily  Cocaine abuse/withdrawal: Gabapentin 300 mg TID  Disposition: Recommend psychiatric Inpatient admission when medically cleared.  Waylan Boga, NP 02/21/2022 12:01 PM

## 2022-02-21 NOTE — ED Provider Notes (Signed)
Emergency Medicine Observation Re-evaluation Note  Susan [name] is a 37 y.o. female, seen on rounds today.  Pt initially presented to the ED for complaints of Mental Health Problem Currently, the patient is resting, voices no medical complaints.  Physical Exam  BP (!) 142/103   Pulse (!) 125   Temp 99.5 F (37.5 C) (Oral)   Resp 18   LMP 01/21/2022 (Approximate)   SpO2 100%  Physical Exam General: Resting in no acute distress Cardiac: No cyanosis Lungs: Equal rise and fall Psych: Not agitated  ED Course / MDM  EKG:   I have reviewed the labs performed to date as well as medications administered while in observation.  Recent changes in the last 24 hours include no events overnight.  Plan  Current plan is for psychiatric disposition.    Paulette Blanch, MD 02/21/22 [phone]

## 2022-02-21 NOTE — ED Notes (Addendum)
Hospital dinner tray and a fresh cup of cola with ice in a foam cup were provided. Pts tv turned on to the channel of her liking. Pt requested clean scrubs and a feminine pad after she finished her meal. New scrubs and feminine pad provided for pt. Old meal tray and cups disposed of properly.

## 2022-02-21 NOTE — Progress Notes (Signed)
Susan [name] is a 37 y.o. female presents to the ER from home with friend reports history of schizophrenia recently in jail did not have her medications. The psychiatric team attempted to see the patient. The patient arrived at the ED with some inappropriate behaviors. Later on, she started to present with psychotic behavior. The patient had to be medicated, and vitals were within normal limits. We are unable to complete the patient's psychiatric assessment at this time. I will reassess once she is awake.

## 2022-02-22 DIAGNOSIS — F250 Schizoaffective disorder, bipolar type: Secondary | ICD-10-CM | POA: Diagnosis not present

## 2022-02-22 LAB — RESP PANEL BY RT-PCR (RSV, FLU A&B, COVID)  RVPGX2
Influenza A by PCR: NEGATIVE
Influenza B by PCR: NEGATIVE
Resp Syncytial Virus by PCR: NEGATIVE
SARS Coronavirus 2 by RT PCR: NEGATIVE

## 2022-02-22 NOTE — ED Notes (Signed)
Pt going to [facility]. Advised of bed placement and she verbalized understanding. Transferred with all her personal belongings. Stable, ambulatory and in NAD.

## 2022-02-22 NOTE — ED Notes (Signed)
Covid test result in. Faxed to [facility] at [phone] attn:Veronica. Pt given all her belongings and escorted out to [facility].

## 2022-02-22 NOTE — ED Notes (Signed)
Called main [facility] number to get to the staff receiving the pt, was informed the nurse is currently unavailable. Left number for them to call back.

## 2022-02-22 NOTE — ED Notes (Signed)
Left number on pager listed on chart for call back for report

## 2022-02-22 NOTE — ED Notes (Signed)
EMTALA reviewed by charge RN 

## 2022-02-22 NOTE — ED Notes (Signed)
Per RN receiving report, needed covid result. No testing done, got sample and taken to lab, awaiting results.
